# Patient Record
Sex: Male | Born: 1955 | Race: White | Hispanic: No | Smoking: Current every day smoker
Health system: Southern US, Community
[De-identification: ages and names within clinical notes are randomized; demographics above are authoritative.]

## PROBLEM LIST (undated history)

## (undated) DIAGNOSIS — R569 Unspecified convulsions: Secondary | ICD-10-CM

## (undated) DIAGNOSIS — Z01818 Encounter for other preprocedural examination: Secondary | ICD-10-CM

## (undated) DIAGNOSIS — G8918 Other acute postprocedural pain: Secondary | ICD-10-CM

## (undated) DIAGNOSIS — W19XXXA Unspecified fall, initial encounter: Secondary | ICD-10-CM

## (undated) DIAGNOSIS — G40001 Localization-related (focal) (partial) idiopathic epilepsy and epileptic syndromes with seizures of localized onset, not intractable, with status epilepticus: Secondary | ICD-10-CM

## (undated) DIAGNOSIS — Z87891 Personal history of nicotine dependence: Secondary | ICD-10-CM

## (undated) DIAGNOSIS — I69398 Other sequelae of cerebral infarction: Secondary | ICD-10-CM

## (undated) DIAGNOSIS — G40919 Epilepsy, unspecified, intractable, without status epilepticus: Principal | ICD-10-CM

## (undated) DIAGNOSIS — R609 Edema, unspecified: Secondary | ICD-10-CM

## (undated) DIAGNOSIS — M79642 Pain in left hand: Secondary | ICD-10-CM

## (undated) DIAGNOSIS — Z79899 Other long term (current) drug therapy: Secondary | ICD-10-CM

## (undated) DIAGNOSIS — M795 Residual foreign body in soft tissue: Secondary | ICD-10-CM

## (undated) DIAGNOSIS — R600 Localized edema: Secondary | ICD-10-CM

## (undated) DIAGNOSIS — M19042 Primary osteoarthritis, left hand: Secondary | ICD-10-CM

## (undated) DIAGNOSIS — S40259A Superficial foreign body of unspecified shoulder, initial encounter: Secondary | ICD-10-CM

## (undated) DIAGNOSIS — I739 Peripheral vascular disease, unspecified: Secondary | ICD-10-CM

## (undated) DIAGNOSIS — Z22322 Carrier or suspected carrier of Methicillin resistant Staphylococcus aureus: Secondary | ICD-10-CM

## (undated) DIAGNOSIS — N319 Neuromuscular dysfunction of bladder, unspecified: Secondary | ICD-10-CM

## (undated) DIAGNOSIS — G5602 Carpal tunnel syndrome, left upper limb: Secondary | ICD-10-CM

## (undated) DIAGNOSIS — R52 Pain, unspecified: Secondary | ICD-10-CM

## (undated) DIAGNOSIS — R32 Unspecified urinary incontinence: Secondary | ICD-10-CM

## (undated) DIAGNOSIS — K635 Polyp of colon: Secondary | ICD-10-CM

## (undated) DIAGNOSIS — I999 Unspecified disorder of circulatory system: Secondary | ICD-10-CM

## (undated) DIAGNOSIS — L720 Epidermal cyst: Secondary | ICD-10-CM

## (undated) HISTORY — PX: KNEE SURGERY: SHX244

## (undated) HISTORY — PX: HIP SURGERY: SHX245

## (undated) MED FILL — oxyCODONE/APAP 5-325MG TAB: 5-325 MG | 7 days supply | Qty: 28 | Fill #0 | Status: AC

---

## 2018-08-30 ENCOUNTER — Encounter (HOSPITAL_COMMUNITY): Payer: Self-pay | Admitting: *Deleted

## 2018-08-30 ENCOUNTER — Other Ambulatory Visit: Payer: Self-pay

## 2018-08-30 DIAGNOSIS — F17210 Nicotine dependence, cigarettes, uncomplicated: Secondary | ICD-10-CM | POA: Diagnosis not present

## 2018-08-30 DIAGNOSIS — R600 Localized edema: Secondary | ICD-10-CM | POA: Diagnosis not present

## 2018-08-30 DIAGNOSIS — Z79899 Other long term (current) drug therapy: Secondary | ICD-10-CM | POA: Insufficient documentation

## 2018-08-30 DIAGNOSIS — M25572 Pain in left ankle and joints of left foot: Secondary | ICD-10-CM | POA: Insufficient documentation

## 2018-08-30 DIAGNOSIS — R2243 Localized swelling, mass and lump, lower limb, bilateral: Secondary | ICD-10-CM | POA: Diagnosis present

## 2018-08-30 NOTE — ED Triage Notes (Signed)
Per EMS, [facility], he noticed swelling in his LE after stepping in a hole today.

## 2018-08-31 ENCOUNTER — Emergency Department (HOSPITAL_BASED_OUTPATIENT_CLINIC_OR_DEPARTMENT_OTHER): Payer: Medicaid - Out of State

## 2018-08-31 ENCOUNTER — Emergency Department (HOSPITAL_COMMUNITY): Payer: Medicaid - Out of State

## 2018-08-31 ENCOUNTER — Emergency Department (HOSPITAL_COMMUNITY)
Admission: EM | Admit: 2018-08-31 | Discharge: 2018-08-31 | Disposition: A | Discharge status: Home / Self Care | Payer: Medicaid - Out of State | Attending: Emergency Medicine | Admitting: Emergency Medicine

## 2018-08-31 DIAGNOSIS — R609 Edema, unspecified: Secondary | ICD-10-CM

## 2018-08-31 DIAGNOSIS — R600 Localized edema: Secondary | ICD-10-CM

## 2018-08-31 HISTORY — DX: Unspecified convulsions: R56.9

## 2018-08-31 LAB — BASIC METABOLIC PANEL
Anion gap: 7 (ref 5–15)
BUN: 15 mg/dL (ref 8–23)
CO2: 23 mmol/L (ref 22–32)
Calcium: 8.6 mg/dL — ABNORMAL LOW (ref 8.9–10.3)
Chloride: 111 mmol/L (ref 98–111)
Creatinine, Ser: 0.86 mg/dL (ref 0.61–1.24)
GFR calc Af Amer: >60 mL/min (ref >60)
GFR calc non Af Amer: >60 mL/min (ref >60)
Glucose, Bld: 101 mg/dL — ABNORMAL HIGH (ref 70–99)
Potassium: 3.9 mmol/L (ref 3.5–5.1)
Sodium: 141 mmol/L (ref 135–145)

## 2018-08-31 LAB — CBC WITH DIFFERENTIAL/PLATELET
Abs Immature Granulocytes: 0.02 10*3/uL (ref 0.00–0.07)
BASOS PCT: 1 %
Basophils Absolute: 0 10*3/uL (ref 0.0–0.1)
Eosinophils Absolute: 0.2 10*3/uL (ref 0.0–0.5)
Eosinophils Relative: 3 %
HCT: 41.5 % (ref 39.0–52.0)
Hemoglobin: 13.1 g/dL (ref 13.0–17.0)
Immature Granulocytes: 0 %
Lymphocytes Relative: 29 %
Lymphs Abs: 1.6 10*3/uL (ref 0.7–4.0)
MCH: 31.8 pg (ref 26.0–34.0)
MCHC: 31.6 g/dL (ref 30.0–36.0)
MCV: 100.7 fL — ABNORMAL HIGH (ref 80.0–100.0)
Monocytes Absolute: 0.5 10*3/uL (ref 0.1–1.0)
Monocytes Relative: 9 %
NEUTROS ABS: 3.2 10*3/uL (ref 1.7–7.7)
Neutrophils Relative %: 58 %
Platelets: 192 10*3/uL (ref 150–400)
RBC: 4.12 MIL/uL — ABNORMAL LOW (ref 4.22–5.81)
RDW: 13.9 % (ref 11.5–15.5)
WBC: 5.5 10*3/uL (ref 4.0–10.5)
nRBC: 0 % (ref 0.0–0.2)

## 2018-08-31 LAB — D-DIMER, QUANTITATIVE: D-Dimer, Quant: 0.55 ug/mL-FEU — ABNORMAL HIGH (ref 0.00–0.50)

## 2018-08-31 MED ORDER — FUROSEMIDE 10 MG/ML IJ SOLN
40.0000 mg | Freq: Once | INTRAMUSCULAR | Status: AC
  Administered 2018-08-31: 40 mg via INTRAVENOUS
  Filled 2018-08-31: qty 4

## 2018-08-31 NOTE — ED Notes (Signed)
Pt ambulated to bathroom using his personal walker

## 2018-08-31 NOTE — ED Notes (Signed)
Social work at bedside.  

## 2018-08-31 NOTE — ED Provider Notes (Signed)
[facility]-EMERGENCY DEPT Provider Note: Lowella DellJ. Lane Darlean Warmoth, MD, FACEP  CSN: [id number] MRN: [id number] ARRIVAL: 08/30/18 at 2154 ROOM: WA21/WA21   CHIEF COMPLAINT  Leg Swelling   HISTORY OF PRESENT ILLNESS  08/31/18 2:24 AM Ryan LeschesRoger Figueroa is a 63 y.o. male who is disabled.  He recently traveled here from [place] to visit family.  He is here with about 24-hour history of bilateral lower extremity edema.  The edema is moderate to severe.  He also twisted his left ankle which caused the edema on the left side to be worse.  He describes the pain in his left ankle as a dull ache.  Nothing makes the edema better or worse.  He denies chest pain or shortness of breath apart from chest pain due to a recent rib injury.  He has a seizure disorder with nearly daily grand mal seizures.  He is on Dilantin for this.   Past Medical History:  Diagnosis Date  . Seizures (HCC)     Past Surgical History:  Procedure Laterality Date  . HIP SURGERY    . KNEE SURGERY Bilateral     No family history on file.  Social History   Tobacco Use  . Smoking status: Current Every Day Smoker    Types: Cigarettes  . Smokeless tobacco: Never Used  Substance Use Topics  . Alcohol use: Never    Frequency: Never  . Drug use: Never    Prior to Admission medications   Medication Sig Start Date End Date Taking? Authorizing Provider  omeprazole (PRILOSEC) 40 MG capsule Take 40 mg by mouth daily.   Yes [provider]  phenytoin (DILANTIN) 100 MG ER capsule Take 100 mg by mouth 3 (three) times daily.   Yes [provider]    Allergies Contrast media [iodinated diagnostic agents] and Ultram [tramadol hcl]   REVIEW OF SYSTEMS  Negative except as noted here or in the History of Present Illness.   PHYSICAL EXAMINATION  Initial Vital Signs Blood pressure (!) 147/103, pulse (!) 102, temperature 98.5 F (36.9 C), temperature source Oral, resp. rate 16, SpO2 99 %.  Examination General: Well-developed,  well-nourished male in no acute distress; appears older than age of record HENT: normocephalic; atraumatic Eyes: Normal appearance Neck: supple Heart: regular rate and rhythm Lungs: clear to auscultation bilaterally Abdomen: soft; nondistended; nontender; bowel sounds present Extremities: No deformity; 2+ pitting edema of lower legs and feet, left slightly greater than the right; mild tenderness of left ankle; pulses normal Neurologic: Awake, alert and oriented; motor function intact in all extremities and symmetric; no facial droop Skin: Warm and dry Psychiatric: Normal mood and affect   RESULTS  Summary of this visit's results, reviewed by myself:   EKG Interpretation  Date/Time:    Ventricular Rate:    PR Interval:    QRS Duration:   QT Interval:    QTC Calculation:   R Axis:     Text Interpretation:        Laboratory Studies: Results for orders placed or performed during the hospital encounter of 08/31/18 (from the past 24 hour(s))  D-dimer, quantitative (not at [facility])     Status: Abnormal   Collection Time: 08/31/18  2:47 AM  Result Value Ref Range   D-Dimer, Quant 0.55 (H) 0.00 - 0.50 ug/mL-FEU  CBC with Differential/Platelet     Status: Abnormal   Collection Time: 08/31/18  2:47 AM  Result Value Ref Range   WBC 5.5 4.0 - 10.5 K/uL   RBC 4.12 (  L) 4.22 - 5.81 MIL/uL   Hemoglobin 13.1 13.0 - 17.0 g/dL   HCT 11.5 72.6 - 20.3 %   MCV 100.7 (H) 80.0 - 100.0 fL   MCH 31.8 26.0 - 34.0 pg   MCHC 31.6 30.0 - 36.0 g/dL   RDW 55.9 74.1 - 63.8 %   Platelets 192 150 - 400 K/uL   nRBC 0.0 0.0 - 0.2 %   Neutrophils Relative % 58 %   Neutro Abs 3.2 1.7 - 7.7 K/uL   Lymphocytes Relative 29 %   Lymphs Abs 1.6 0.7 - 4.0 K/uL   Monocytes Relative 9 %   Monocytes Absolute 0.5 0.1 - 1.0 K/uL   Eosinophils Relative 3 %   Eosinophils Absolute 0.2 0.0 - 0.5 K/uL   Basophils Relative 1 %   Basophils Absolute 0.0 0.0 - 0.1 K/uL   Immature Granulocytes 0 %   Abs Immature  Granulocytes 0.02 0.00 - 0.07 K/uL  Basic metabolic panel     Status: Abnormal   Collection Time: 08/31/18  2:47 AM  Result Value Ref Range   Sodium 141 135 - 145 mmol/L   Potassium 3.9 3.5 - 5.1 mmol/L   Chloride 111 98 - 111 mmol/L   CO2 23 22 - 32 mmol/L   Glucose, Bld 101 (H) 70 - 99 mg/dL   BUN 15 8 - 23 mg/dL   Creatinine, Ser 4.53 0.61 - 1.24 mg/dL   Calcium 8.6 (L) 8.9 - 10.3 mg/dL   GFR calc non Af Amer >60 >60 mL/min   GFR calc Af Amer >60 >60 mL/min   Anion gap 7 5 - 15   Imaging Studies: Dg Ankle Complete Left  Result Date: 08/31/2018 CLINICAL DATA:  Ankle pain/swelling EXAM: LEFT ANKLE COMPLETE - 3+ VIEW COMPARISON:  None. FINDINGS: No fracture or dislocation is seen. The ankle mortise is intact. The base of the fifth metatarsal is unremarkable. Mild diffuse soft tissue swelling. IMPRESSION: No fracture or dislocation is seen. Mild diffuse soft tissue swelling. Electronically Signed   By: Charline Bills M.D.   On: 08/31/2018 03:15    ED COURSE and MDM  Nursing notes and initial vitals signs, including pulse oximetry, reviewed.  Vitals:   08/31/18 0445 08/31/18 0500 08/31/18 0515 08/31/18 0530  BP:  123/78  (!) 122/93  Pulse:   76 72  Resp:    15  Temp:      TempSrc:      SpO2: 100%   97%   7:29 AM Legs less tense after Lasix diuresis.  Awaiting lower extremity Doppler studies to evaluate for DVTs.  PROCEDURES    ED DIAGNOSES     ICD-10-CM   1. Bilateral lower extremity edema R60.0        Maxximus Gotay, MD 09/03/18 1049

## 2018-08-31 NOTE — ED Notes (Signed)
Pt called nurse to bathroom to look at pt's stool.  It was loose, orange in color with mucus.  Pt stated to this RN he was concerned about his gallbladder. Pt c/o of abdominal pain in LLQ.

## 2018-08-31 NOTE — Progress Notes (Signed)
CSW aware of consult. CSW spoke with patient at bedside who reports he is currently homeless and staying at the [facility]. Per patient, he just moved here from [place] to help out his son but that they are not currently in communication. Patient states he cannot currently afford his medication for his seizures. Per patient, with his insurance his co-pay is only a few dollars. CSW consulted with Chiropodist who provided patient with financial assistance for medications. CSW also provided patient with a homeless shelter resources list and bus passes to get to [place] and then back to [place]. Patient expressed no further CSW needs at this time. Please reconsult if needs arise.  Archie Balboa, LCSWA  Clinical Social Work Department  [employer]  [phone]

## 2018-08-31 NOTE — ED Notes (Signed)
This Clinical research associate called and left a message to social work.

## 2018-08-31 NOTE — Progress Notes (Signed)
Bilateral lower extremity venous duplex has been completed. Negative for DVT. Results were given to Dr. Madilyn Hook.   08/31/18 8:50 AM Olen Cordial RVT

## 2018-08-31 NOTE — ED Notes (Signed)
When trying to D/C pt, he states he needed to speak to a Child psychotherapist before leaving.  MD notified.

## 2018-09-05 ENCOUNTER — Other Ambulatory Visit: Payer: Self-pay | Admitting: Critical Care Medicine

## 2018-09-05 ENCOUNTER — Encounter: Payer: Self-pay | Admitting: Critical Care Medicine

## 2018-09-05 MED ORDER — TAMSULOSIN HCL 0.4 MG PO CAPS: 0.4000 mg | ORAL_CAPSULE | Freq: Every day | ORAL | 3 refills | Status: AC

## 2018-09-05 MED ORDER — PHENYTOIN SODIUM EXTENDED 100 MG PO CAPS: 100.0000 mg | ORAL_CAPSULE | Freq: Three times a day (TID) | ORAL | 1 refills | Status: AC

## 2018-09-05 MED ORDER — AMOXICILLIN-POT CLAVULANATE 875-125 MG PO TABS: 1.0000 | ORAL_TABLET | Freq: Two times a day (BID) | ORAL | 0 refills | Status: DC

## 2018-09-05 MED ORDER — OMEPRAZOLE 40 MG PO CPDR: 40.0000 mg | DELAYED_RELEASE_CAPSULE | Freq: Every day | ORAL | 1 refills | Status: AC

## 2018-09-05 NOTE — Progress Notes (Signed)
See documentation  Bronchitis, refills, hemorrhoids

## 2018-09-06 NOTE — Progress Notes (Signed)
This is a 63 year old white male who was seen in the [facility] shelter clinic with needs for refilled medications.  The patient gives a history of just moving here from [place] 3 weeks ago.  He was hospitalized in a while [place] for pneumonia and a seizure disorder.  He was placed on seizure medications which was Dilantin 100 mg 3 times daily and he has now out of this medication.  The patient also has reflux disease and takes omeprazole.  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  He does require a walker to ambulate.  The seizure has been a lifelong diagnosis.  He smokes less than a half a pack a day of cigarettes.  Allergies for this patient includes tramadol and IV contrast  Patient also states he has had some loose stools with blood mixed with the stool.  He is also had minimal hemoptysis for the past 2 days.  He denies any fever.  The patient currently is staying in the lobby of the [facility] and has not yet acquired a bed upstairs.  The health system he came from in [place] is not on epic so I do not have access to any of his medical records.  Note the patient did go to the emergency room on January 17 for evaluation of lower extremity edema.  The patient had a negative left ankle x-ray.  Vascular ultrasound of both lower extremities showed no deep venous thrombosis.  The patient was given 1 dose of furosemide on discharge.   Physical exam  Temperature 97 pulse 74 saturations 95% room air  Chest exam was clear without evidence of wheeze rale or rhonchi  Cardiac exam showed regular rate and rhythm without S3 or S4 normal S1-S2 no murmur  Abdomen exam was benign  Extremities showed no tenderness in the knees or hips   There was 2+  edema left greater than right lower extremity  Rectal  exam shows significant hemorrhoids with no evidence of blood   Impression   History of seizure disorder and the patient is now out of Dilantin        a bridging prescription of Dilantin 100 mg 3 times daily was sent to our local pharmacy for the Congregational nurse to obtain   History of benign prostatic hypertrophy with need for Flomax refill    a refill of Flomax was also sent to the local pharmacy   Gastroesophageal reflux disease and previous gastric ulcer   A refill on omeprazole 40 mg daily was sent to the pharmacy   Bilateral lower extremity edema with negative recent venous Doppler ultrasound   This patient would benefit from a follow-up visit in my clinic [facility] for further evaluations and laboratory studies  Lab Results  Component Value Date   WBC 5.5 08/31/2018   HGB 13.1 08/31/2018   HCT 41.5 08/31/2018   MCV 100.7 (H) 08/31/2018   PLT 192 08/31/2018   BMP Latest Ref Rng & Units 08/31/2018  Glucose 70 - 99 mg/dL 953(U)  BUN 8 - 23 mg/dL 15  Creatinine 0.23 - 3.43 mg/dL 5.68  Sodium 616 - 837 mmol/L 141  Potassium 3.5 - 5.1 mmol/L 3.9  Chloride 98 - 111 mmol/L 111  CO2 22 - 32 mmol/L 23  Calcium 8.9 - 10.3 mg/dL 2.9(M)

## 2018-09-08 ENCOUNTER — Encounter (HOSPITAL_COMMUNITY): Payer: Self-pay | Admitting: Emergency Medicine

## 2018-09-08 ENCOUNTER — Other Ambulatory Visit: Payer: Self-pay

## 2018-09-08 ENCOUNTER — Emergency Department (HOSPITAL_COMMUNITY)
Admission: EM | Admit: 2018-09-08 | Discharge: 2018-09-09 | Disposition: A | Discharge status: Home / Self Care | Payer: Self-pay | Attending: Emergency Medicine | Admitting: Emergency Medicine

## 2018-09-08 DIAGNOSIS — L03116 Cellulitis of left lower limb: Secondary | ICD-10-CM | POA: Insufficient documentation

## 2018-09-08 DIAGNOSIS — F17210 Nicotine dependence, cigarettes, uncomplicated: Secondary | ICD-10-CM | POA: Insufficient documentation

## 2018-09-08 NOTE — ED Triage Notes (Signed)
Patient c/o swelling to bilateral legs x1 week. Seen on 1/17 for same. Redness noted.

## 2018-09-09 LAB — COMPREHENSIVE METABOLIC PANEL
ALT: 15 U/L (ref 0–44)
AST: 19 U/L (ref 15–41)
Albumin: 3.6 g/dL (ref 3.5–5.0)
Alkaline Phosphatase: 155 U/L — ABNORMAL HIGH (ref 38–126)
Anion gap: 7 (ref 5–15)
BUN: 17 mg/dL (ref 8–23)
CO2: 19 mmol/L — ABNORMAL LOW (ref 22–32)
Calcium: 8.3 mg/dL — ABNORMAL LOW (ref 8.9–10.3)
Chloride: 112 mmol/L — ABNORMAL HIGH (ref 98–111)
Creatinine, Ser: 0.89 mg/dL (ref 0.61–1.24)
GFR calc Af Amer: >60 mL/min (ref >60)
GFR calc non Af Amer: >60 mL/min (ref >60)
GLUCOSE: 101 mg/dL — AB (ref 70–99)
Potassium: 3.6 mmol/L (ref 3.5–5.1)
Sodium: 138 mmol/L (ref 135–145)
TOTAL PROTEIN: 6.5 g/dL (ref 6.5–8.1)
Total Bilirubin: 0.4 mg/dL (ref 0.3–1.2)

## 2018-09-09 LAB — CBC WITH DIFFERENTIAL/PLATELET
Abs Immature Granulocytes: 0.01 10*3/uL (ref 0.00–0.07)
Basophils Absolute: 0 10*3/uL (ref 0.0–0.1)
Basophils Relative: 1 %
Eosinophils Absolute: 0.2 10*3/uL (ref 0.0–0.5)
Eosinophils Relative: 3 %
HCT: 37.2 % — ABNORMAL LOW (ref 39.0–52.0)
Hemoglobin: 11.8 g/dL — ABNORMAL LOW (ref 13.0–17.0)
Immature Granulocytes: 0 %
Lymphocytes Relative: 24 %
Lymphs Abs: 1.2 10*3/uL (ref 0.7–4.0)
MCH: 32.2 pg (ref 26.0–34.0)
MCHC: 31.7 g/dL (ref 30.0–36.0)
MCV: 101.4 fL — ABNORMAL HIGH (ref 80.0–100.0)
Monocytes Absolute: 0.6 10*3/uL (ref 0.1–1.0)
Monocytes Relative: 12 %
Neutro Abs: 2.9 10*3/uL (ref 1.7–7.7)
Neutrophils Relative %: 60 %
PLATELETS: 168 10*3/uL (ref 150–400)
RBC: 3.67 MIL/uL — ABNORMAL LOW (ref 4.22–5.81)
RDW: 14.2 % (ref 11.5–15.5)
WBC: 4.8 10*3/uL (ref 4.0–10.5)
nRBC: 0 % (ref 0.0–0.2)

## 2018-09-09 LAB — PHENYTOIN LEVEL, TOTAL: Phenytoin Lvl: 3.9 ug/mL — ABNORMAL LOW (ref 10.0–20.0)

## 2018-09-09 LAB — LACTIC ACID, PLASMA: Lactic Acid, Venous: 1.5 mmol/L (ref 0.5–1.9)

## 2018-09-09 MED ORDER — DOXYCYCLINE HYCLATE 100 MG PO TABS
100.0000 mg | ORAL_TABLET | Freq: Once | ORAL | Status: AC
  Administered 2018-09-09: 100 mg via ORAL
  Filled 2018-09-09: qty 1

## 2018-09-09 MED ORDER — VANCOMYCIN HCL IN DEXTROSE 1-5 GM/200ML-% IV SOLN
1000.0000 mg | Freq: Once | INTRAVENOUS | Status: AC
  Administered 2018-09-09: 1000 mg via INTRAVENOUS
  Filled 2018-09-09: qty 200

## 2018-09-09 MED ORDER — DOXYCYCLINE HYCLATE 100 MG PO CAPS: 100.0000 mg | ORAL_CAPSULE | Freq: Two times a day (BID) | ORAL | 0 refills | Status: DC

## 2018-09-09 MED ORDER — HYDROCODONE-ACETAMINOPHEN 5-325 MG PO TABS
1.0000 | ORAL_TABLET | Freq: Once | ORAL | Status: AC
  Administered 2018-09-09: 1 via ORAL
  Filled 2018-09-09: qty 1

## 2018-09-09 NOTE — Discharge Instructions (Signed)
We saw in the ER for worsening in the leg swelling.  Please add doxycycline to your Augmentin.   Try to see if he can be seen by the outpatient doctor again within the next few days.  If they are unable to see you then return to the ER in 2 to 3 days for wound recheck

## 2018-09-09 NOTE — ED Provider Notes (Signed)
[facility]-EMERGENCY DEPT Provider Note   CSN: [id number] Arrival date & time: 09/08/18  2247     History   Chief Complaint Chief Complaint  Patient presents with  . Leg Swelling    HPI Rozann LeschesRoger Balaguer is a 63 y.o. male.  HPI 63 year old male comes in with chief complaint of leg swelling and pain. He has history of seizure disorder, BPH.  He is new to this area and followed by community wellness clinic.  Patient reports that he has been having bilateral lower extremity swelling for the last several days.  He was started on antibiotics by the outpatient team, but despite taking the medications his symptoms have progressed.  Patient denies any nausea, vomiting, fevers, chills.  The main reason for him to come to the ER is because of increasing swelling with pain -precautions ever given to him by the outpatient team.  Past Medical History:  Diagnosis Date  . Seizures (HCC)     There are no active problems to display for this patient.   Past Surgical History:  Procedure Laterality Date  . HIP SURGERY    . KNEE SURGERY Bilateral         Home Medications    Prior to Admission medications   Medication Sig Start Date End Date Taking? Authorizing Provider  amoxicillin-clavulanate (AUGMENTIN) 875-125 MG tablet Take 1 tablet by mouth 2 (two) times daily. 09/05/18  Yes Storm FriskWright, Patrick E, MD  omeprazole (PRILOSEC) 40 MG capsule Take 1 capsule (40 mg total) by mouth daily. 09/05/18  Yes Storm FriskWright, Patrick E, MD  phenytoin (DILANTIN) 100 MG ER capsule Take 1 capsule (100 mg total) by mouth 3 (three) times daily. 09/05/18  Yes Storm FriskWright, Patrick E, MD  tamsulosin (FLOMAX) 0.4 MG CAPS capsule Take 1 capsule (0.4 mg total) by mouth daily. 09/05/18  Yes Storm FriskWright, Patrick E, MD  doxycycline (VIBRAMYCIN) 100 MG capsule Take 1 capsule (100 mg total) by mouth 2 (two) times daily. 09/09/18   Derwood KaplanNanavati, Damon Hargrove, MD    Family History No family history on file.  Social  History Social History   Tobacco Use  . Smoking status: Current Every Day Smoker    Types: Cigarettes  . Smokeless tobacco: Never Used  Substance Use Topics  . Alcohol use: Never    Frequency: Never  . Drug use: Never     Allergies   Contrast media [iodinated diagnostic agents] and Ultram [tramadol hcl]   Review of Systems Review of Systems  Constitutional: Positive for activity change. Negative for fever.  Gastrointestinal: Negative for nausea and vomiting.  Musculoskeletal: Positive for myalgias.  Skin: Positive for rash.  Allergic/Immunologic: Negative for immunocompromised state.     Physical Exam Updated Vital Signs BP 114/80   Pulse 100   Temp 98.5 F (36.9 C) (Oral)   Resp 18   SpO2 93%   Physical Exam Vitals signs and nursing note reviewed.  Constitutional:      Appearance: He is well-developed.  Cardiovascular:     Rate and Rhythm: Normal rate.  Pulmonary:     Effort: Pulmonary effort is normal.  Musculoskeletal:        General: Swelling present.     Right lower leg: Edema present.     Left lower leg: Edema present.     Comments: Patient has bilateral lower extremity pitting edema, left worse than right.  He also has fine rubor and calor along with tenderness to palpation.  Skin:    General: Skin is warm.  Neurological:     Mental Status: He is alert and oriented to person, place, and time.          ED Treatments / Results  Labs (all labs ordered are listed, but only abnormal results are displayed) Labs Reviewed  COMPREHENSIVE METABOLIC PANEL - Abnormal; Notable for the following components:      Result Value   Chloride 112 (*)    CO2 19 (*)    Glucose, Bld 101 (*)    Calcium 8.3 (*)    Alkaline Phosphatase 155 (*)    All other components within normal limits  CBC WITH DIFFERENTIAL/PLATELET - Abnormal; Notable for the following components:   RBC 3.67 (*)    Hemoglobin 11.8 (*)    HCT 37.2 (*)    MCV 101.4 (*)    All other  components within normal limits  PHENYTOIN LEVEL, TOTAL - Abnormal; Notable for the following components:   Phenytoin Lvl 3.9 (*)    All other components within normal limits  LACTIC ACID, PLASMA  LACTIC ACID, PLASMA    EKG None  Radiology No results found.  Procedures Procedures (including critical care time)  Medications Ordered in ED Medications  HYDROcodone-acetaminophen (NORCO/VICODIN) 5-325 MG per tablet 1 tablet (1 tablet Oral Given 09/09/18 0113)  vancomycin (VANCOCIN) IVPB 1000 mg/200 mL premix (1,000 mg Intravenous New Bag/Given 09/09/18 0230)  doxycycline (VIBRA-TABS) tablet 100 mg (100 mg Oral Given 09/09/18 0230)     Initial Impression / Assessment and Plan / ED Course  I have reviewed the triage vital signs and the nursing notes.  Pertinent labs & imaging results that were available during my care of the patient were reviewed by me and considered in my medical decision making (see chart for details).     63 year old male comes in with chief complaint of worsening leg swelling and pain. He is being treated as a cellulitis with Augmentin.  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  He does not have any systemic symptoms, the labs are reassuring.  We have given him IV vancomycin here, I think he would benefit by addition of doxycycline to the Augmentin prescription.  The other possibilities that he is having stasis dermatitis.  I encouraged patient that he needs to get compression stockings and keep his legs elevated at nighttime.  We have advised patient to return to the ER in 2 days for wound recheck if he cannot secure an outpatient follow-up within the next 2 to 3 days.  Strict ER return precautions have also been discussed and patient is in agreement with the plan. His Dilantin level was ordered for the benefit of outpatient team, we will not be making any adjustment to his Dilantin dose.  Final Clinical Impressions(s) / ED Diagnoses   Final diagnoses:   Cellulitis of left lower extremity    ED Discharge Orders         Ordered    doxycycline (VIBRAMYCIN) 100 MG capsule  2 times daily     09/09/18 0500           Derwood KaplanNanavati, Dyamond Tolosa, MD 09/09/18 [phone]

## 2018-09-10 ENCOUNTER — Encounter (HOSPITAL_COMMUNITY): Payer: Self-pay | Admitting: Emergency Medicine

## 2018-09-10 ENCOUNTER — Emergency Department (HOSPITAL_COMMUNITY)
Admission: EM | Admit: 2018-09-10 | Discharge: 2018-09-10 | Disposition: A | Discharge status: Home / Self Care | Payer: Self-pay | Attending: Emergency Medicine | Admitting: Emergency Medicine

## 2018-09-10 ENCOUNTER — Emergency Department (HOSPITAL_COMMUNITY): Payer: Self-pay

## 2018-09-10 DIAGNOSIS — I878 Other specified disorders of veins: Secondary | ICD-10-CM

## 2018-09-10 DIAGNOSIS — Z86718 Personal history of other venous thrombosis and embolism: Secondary | ICD-10-CM | POA: Insufficient documentation

## 2018-09-10 DIAGNOSIS — I872 Venous insufficiency (chronic) (peripheral): Secondary | ICD-10-CM | POA: Insufficient documentation

## 2018-09-10 DIAGNOSIS — Z79899 Other long term (current) drug therapy: Secondary | ICD-10-CM | POA: Insufficient documentation

## 2018-09-10 DIAGNOSIS — F17210 Nicotine dependence, cigarettes, uncomplicated: Secondary | ICD-10-CM | POA: Insufficient documentation

## 2018-09-10 LAB — URINALYSIS, ROUTINE W REFLEX MICROSCOPIC
Bilirubin Urine: NEGATIVE
Glucose, UA: NEGATIVE mg/dL
Ketones, ur: NEGATIVE mg/dL
Leukocytes, UA: NEGATIVE
Nitrite: NEGATIVE
Protein, ur: NEGATIVE mg/dL
SPECIFIC GRAVITY, URINE: 1.005 (ref 1.005–1.030)
pH: 5 (ref 5.0–8.0)

## 2018-09-10 LAB — I-STAT TROPONIN, ED: Troponin i, poc: 0.01 ng/mL (ref 0.00–0.08)

## 2018-09-10 LAB — BRAIN NATRIURETIC PEPTIDE: B Natriuretic Peptide: 19.6 pg/mL (ref 0.0–100.0)

## 2018-09-10 MED ORDER — VANCOMYCIN HCL IN DEXTROSE 1-5 GM/200ML-% IV SOLN
1000.0000 mg | Freq: Once | INTRAVENOUS | Status: AC
  Administered 2018-09-10: 1000 mg via INTRAVENOUS
  Filled 2018-09-10: qty 200

## 2018-09-10 MED ORDER — FUROSEMIDE 10 MG/ML IJ SOLN
20.0000 mg | Freq: Once | INTRAMUSCULAR | Status: AC
  Administered 2018-09-10: 20 mg via INTRAVENOUS
  Filled 2018-09-10: qty 4

## 2018-09-10 MED ORDER — FUROSEMIDE 20 MG PO TABS: 20.0000 mg | ORAL_TABLET | Freq: Every day | ORAL | 0 refills | Status: DC

## 2018-09-10 NOTE — ED Triage Notes (Signed)
Pt c/o leg swelling again. "this makes the third time I have had to come in here".

## 2018-09-10 NOTE — ED Notes (Signed)
Patient transported to X-ray 

## 2018-09-10 NOTE — Progress Notes (Signed)
CSW aware of consult. CSW familiar with patient from prior visit. CSW spoke with patient at bedside who reports [facility] and was supposed to meet with intake this morning about getting a bed. Per patient, someone had told the person running the shelter yesterday that he was contagious and could not return. [facility] with [facility] who stated patient could return but that there were no beds available at this time but he could continue to stay in the lobby. CSW relayed this information to patient who expressed understanding. CSW also provided patient with information [place] [facility] and homeless shelter resources. Per patient, staying in the shelter is only temporary and that once he gets his insurance transferred from [place] to [place] then he would like to go to an ALF or look into buying a house. Patient declined needing further resources. Please reconsult if needs arise.  Archie BalboaMackenzie Irwin, LCSWA  Clinical Social Work Department  [facility]  [phone]

## 2018-09-10 NOTE — Discharge Instructions (Signed)
1.  Call the wound care center and get an appointment as soon as possible.  The main treatment for venous stasis is compression, elevation and managing all underlying medical conditions. 2.  Take 20 mg of Lasix daily for the next 3 to 4 days.  After that, you will need to see your medical doctor and determine if ongoing Lasix is appropriate for you as well as your response to this treatment. 3.  Fill the prescription of doxycycline that was given to you yesterday.  Start it on the morning of 1\28. 4.  Although you are getting antibiotics, your swelling is most likely the result of venous stasis which is a condition of poor blood flow through the veins.  Cellulitis is not contagious and venous stasis is not contagious.  You are safe to return to [facility] without risk of infection of others. 5.  Continue treatment as outlined.  Return to the emergency department if you are not seeing any improvement in the next 3 to 4 days or if there is worsening or new symptoms develop.

## 2018-09-10 NOTE — ED Notes (Signed)
Patient given discharge teaching and verbalized understanding. Patient ambulated out of ED with a steady gait. 

## 2018-09-10 NOTE — ED Provider Notes (Signed)
[facility]-EMERGENCY DEPT Provider Note   CSN: [id number] Arrival date & time: 09/10/18  7948     History   Chief Complaint Chief Complaint  Patient presents with  . Leg Swelling    HPI Ryan Figueroa is a 63 y.o. male.  HPI Patient reports he has ongoing swelling of both of his legs.  He reports this started predominantly in the left leg but then progressed to include both legs.  This has been going on for several months.  Patient came to this area from [place] on a [employer] bus.  He reports he had about a 16-hour bus ride.  That however was a couple months ago.  He has been seen several times since then.  He reports nothing is helping it improved.  He reports he continues to be red swollen and uncomfortable.  He denies before his travel, having problems with swelling like this.  He denies chest pain or shortness of breath.  Patient is a chronic smoker since young age.  No alcohol use.  He reports he has h/o epilepsy so he does not drink alcohol.  He is currently living at [facility].  He reports that the threw him out because somebody started saying that he was contagious because his legs were red and swollen.  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 Past Medical History:  Diagnosis Date  . Seizures (HCC)     There are no active problems to display for this patient.   Past Surgical History:  Procedure Laterality Date  . HIP SURGERY    . KNEE SURGERY Bilateral         Home Medications    Prior to Admission medications   Medication Sig Start Date End Date Taking? Authorizing Provider  amoxicillin-clavulanate (AUGMENTIN) 875-125 MG tablet Take 1 tablet by mouth 2 (two) times daily. 09/05/18  Yes Storm Frisk, MD  omeprazole (PRILOSEC) 40 MG capsule Take 1 capsule (40 mg total) by mouth daily. 09/05/18  Yes Storm Frisk, MD  phenytoin (DILANTIN) 100 MG ER capsule Take 1 capsule (100 mg total) by mouth 3 (three) times daily. 09/05/18  Yes Storm Frisk, MD  tamsulosin (FLOMAX) 0.4 MG CAPS capsule Take 1 capsule (0.4 mg total) by mouth daily. 09/05/18  Yes Storm Frisk, MD  doxycycline (VIBRAMYCIN) 100 MG capsule Take 1 capsule (100 mg total) by mouth 2 (two) times daily. 09/09/18   Derwood Kaplan, MD  furosemide (LASIX) 20 MG tablet Take 1 tablet (20 mg total) by mouth daily. 09/10/18   Arby Barrette, MD    Family History No family history on file.  Social History Social History   Tobacco Use  . Smoking status: Current Every Day Smoker    Types: Cigarettes  . Smokeless tobacco: Never Used  Substance Use Topics  . Alcohol use: Never    Frequency: Never  . Drug use: Never     Allergies   Contrast media [iodinated diagnostic agents] and Ultram [tramadol hcl]   Review of Systems Review of Systems 10 Systems reviewed and are negative for acute change except as noted in the HPI.   Physical Exam Updated Vital Signs BP (!) 142/83 (BP Location: Right Arm)   Pulse 87   Temp 97.6 F (36.4 C) (Oral)   Resp 18   SpO2 100%  Physical Exam Constitutional:      Comments: Patient is alert and nontoxic.  Mental status is clear.  No respiratory distress.  Clinically well in appearance.  HENT:     Head: Normocephalic and atraumatic.     Mouth/Throat:     Comments: Poor dentition with upper dentition few teeth that are decayed to the gumline.  Lower dentition few teeth present but severe decay.  No periodontal inflammation or facial swelling.  Posterior airway widely patent. Eyes:     Extraocular Movements: Extraocular movements intact.  Cardiovascular:     Rate and Rhythm: Normal rate and regular rhythm.     Pulses: Normal pulses.     Heart sounds: Normal heart sounds.  Pulmonary:     Effort: Pulmonary effort is normal.     Breath sounds: Normal breath sounds.  Abdominal:      General: There is no distension.     Palpations: Abdomen is soft.     Tenderness: There is no abdominal tenderness. There is no guarding.  Musculoskeletal:     Comments: Patient has 3+ pitting edema bilateral lower extremities below the knee.  Left slightly greater than right.  No effusion or erythema of the knees.  Both knees have old, well-healed incisions from replacement.  Erythema is diffuse.  Pedal pulses intact.  No open wounds.  See attached images.  Skin:    General: Skin is warm and dry.  Neurological:     General: No focal deficit present.     Mental Status: He is oriented to person, place, and time.     Coordination: Coordination normal.  Psychiatric:        Mood and Affect: Mood normal.        ED Treatments / Results  Labs (all labs ordered are listed, but only abnormal results are displayed) Labs Reviewed  BRAIN NATRIURETIC PEPTIDE  URINALYSIS, ROUTINE W REFLEX MICROSCOPIC  I-STAT TROPONIN, ED    EKG None  Radiology Dg Chest 2 View  Result Date: 09/10/2018 CLINICAL DATA:  Bilateral lower extremity swelling. EXAM: CHEST - 2 VIEW COMPARISON:  None. FINDINGS: The cardiomediastinal silhouette is within normal limits. The lungs are hyperinflated with mild interstitial coarsening. No confluent airspace opacity, edema, pleural effusion, or pneumothorax is identified. A lateral left fourth rib fracture is likely nonacute. IMPRESSION: Suspected COPD without evidence of pneumonia or edema. Electronically Signed   By: Sebastian Ache M.D.   On: 09/10/2018 08:06    Procedures Procedures (including critical care time)  Medications Ordered in ED Medications  vancomycin (VANCOCIN) IVPB 1000 mg/200 mL premix (1,000 mg Intravenous New Bag/Given 09/10/18 0857)  furosemide (LASIX) injection 20 mg (20 mg Intravenous Given 09/10/18 0854)     Initial Impression / Assessment and Plan / ED Course  I have reviewed the triage vital signs and the nursing notes.  Pertinent labs &  imaging results that were available during my care of the patient were reviewed by me and considered in my medical decision making (see chart for details).     Patient is clinically well in appearance.  He does not show signs of systemic infection.  His white blood cell count has been stable.  Patient has not developed fever.  Patient has empirically been treated for cellulitis and given vancomycin yesterday and discharged with prescription for doxycycline which he reports he has not yet filled.  He has been on Augmentin for almost 10 days without improvement.  Appearance looks unchanged from documentation previously.  I have a  high suspicion for chronic venous stasis as a contributing factor to the patient's edema.  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  He has pre-existing conditions that would be consistent with venous stasis.  He is living at [facility] and not really been able to elevate his legs.  He does spend much time sitting or standing.  1 of the main reasons the patient has come today, is because [facility] ejected him due to someone stating that he was contagious with MRSA.  Chest x-ray does not show vascular congestion and BNP is normal making CHF much lower probability.  Patient does not endorse review of systems consistent with CHF.  At this time will treat with low-dose Lasix to help with edema, patient is given ambulatory referral to wound care clinic for evaluation for compression and wraps.  Patient is getting established with routine medical care and does have outpatient follow-up in place.  Return precautions reviewed.  Final Clinical Impressions(s) / ED Diagnoses   Final diagnoses:  Venous stasis    ED Discharge Orders         Ordered    furosemide (LASIX) 20 MG tablet  Daily     09/10/18 1000    AMB referral to wound care center    Comments:  Compression and venous stasis management   09/10/18 1008           Arby BarrettePfeiffer,  Marvena Tally, MD 09/10/18 1018

## 2018-09-10 NOTE — ED Notes (Signed)
ED Provider at bedside. 

## 2018-09-10 NOTE — ED Notes (Signed)
Urinal at bedside. Pt has been made aware that MD needs UA sample. 

## 2018-09-12 ENCOUNTER — Encounter: Payer: Self-pay | Admitting: Critical Care Medicine

## 2018-09-12 ENCOUNTER — Other Ambulatory Visit: Payer: Self-pay | Admitting: Critical Care Medicine

## 2018-09-12 MED ORDER — TRIAMCINOLONE ACETONIDE 0.1 % EX CREA: 1.0000 "application " | TOPICAL_CREAM | Freq: Three times a day (TID) | CUTANEOUS | 0 refills | Status: AC

## 2018-09-12 MED ORDER — DOXYCYCLINE HYCLATE 100 MG PO CAPS: 100.0000 mg | ORAL_CAPSULE | Freq: Two times a day (BID) | ORAL | 0 refills | Status: AC

## 2018-09-12 MED ORDER — FUROSEMIDE 20 MG PO TABS: 20.0000 mg | ORAL_TABLET | Freq: Every day | ORAL | 0 refills | Status: AC

## 2018-09-12 NOTE — Progress Notes (Unsigned)
See documentation.

## 2018-09-12 NOTE — Progress Notes (Signed)
This is a 63 year old white male who was seen in the [facility] with needs for refilled medications.  The patient gives a history of just moving here from [place] 4 weeks ago.  He was hospitalized in a while [place] for pneumonia and a seizure disorder.  He was placed on seizure medications which was Dilantin 100 mg 3 times daily  The patient also has reflux disease and takes omeprazole.  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  He does require a walker to ambulate.  The seizure has been a lifelong diagnosis.  He smokes less than a half a pack a day of cigarettes.  Allergies for this patient includes tramadol and IV contrast   The patient currently is staying in the lobby of the [facility] and has not yet acquired a bed upstairs.  The health system he came from in [place] is not on epic so I do not have access to any of his medical records.  Note the patient did go to the emergency room on January 17 for evaluation of lower extremity edema.  The patient had a negative left ankle x-ray.  Vascular ultrasound of both lower extremities showed no deep venous thrombosis.  The patient was given 1 dose of furosemide on discharge.  I saw the patient a week ago on January 22 and diagnosed venous stasis dermatitis and early cellulitis in the left leg.  At that visit we prescribed Augmentin.  The patient actually did not improve that much with the Augmentin and went to the emergency room on 09/09/2018.  At that visit he was prescribed doxycycline and was given a course of IV vancomycin.  The patient returned to the emergency room on 27 January and given a prescription for Lasix for which he has not yet filled.  Physical exam  Temperature 97 pulse 74 saturations 95% room air blood pressure  126/84  Chest exam was clear without evidence of wheeze rale or rhonchi  Cardiac exam showed regular rate and rhythm without S3 or S4 normal S1-S2 no murmur  Abdomen exam was benign  Extremities showed no tenderness in the knees or hips   There was 2+  edema left greater than right lower extremity There was severe erythema in the left greater than right lower extremity with tenderness to touch     Impression  Bilateral lower extremity edema with negative recent venous Doppler ultrasound, and now ongoing venous stasis dermatitis  The plan would be to send a prescription for the doxycycline to be filled tonight 100 mg twice daily for 7 days he will also get Lasix 20 mg daily for 7 days triamcinolone cream 0.5% to mix with body lotion applied twice daily to both lower extremities  A wound care clinic appointment will also be made   This patient would benefit from a follow-up [facility] for further evaluations and laboratory studies  Lab Results  Component Value Date   WBC 4.8 09/09/2018   HGB 11.8 (L) 09/09/2018   HCT 37.2 (L) 09/09/2018   MCV 101.4 (H) 09/09/2018   PLT 168 09/09/2018   BMP Latest Ref Rng & Units 09/09/2018 08/31/2018  Glucose 70 - 99 mg/dL 161(W101(H) 960(A101(H)  BUN 8 - 23 mg/dL 17 15  Creatinine 5.400.61 - 1.24 mg/dL  0.89 0.86  Sodium 135 - 145 mmol/L 138 141  Potassium 3.5 - 5.1 mmol/L 3.6 3.9  Chloride 98 - 111 mmol/L 112(H) 111  CO2 22 - 32 mmol/L 19(L) 23  Calcium 8.9 - 10.3 mg/dL 8.3(L) 8.6(L)

## 2018-09-17 ENCOUNTER — Other Ambulatory Visit: Payer: Self-pay | Admitting: Critical Care Medicine

## 2018-09-17 DIAGNOSIS — I872 Venous insufficiency (chronic) (peripheral): Secondary | ICD-10-CM

## 2018-09-17 NOTE — Progress Notes (Signed)
Will see if vascular surgery has any ideas about the patients venous stasis dermatitis

## 2018-09-24 ENCOUNTER — Ambulatory Visit (HOSPITAL_BASED_OUTPATIENT_CLINIC_OR_DEPARTMENT_OTHER): Payer: Medicaid - Out of State

## 2019-07-03 IMAGING — CR DG ANKLE COMPLETE 3+V*L*
3 series · 3 of 3 positions shown · non-contrast
Comparison: None.

CLINICAL DATA: Ankle pain/swelling

EXAM:
LEFT ANKLE COMPLETE - 3+ VIEW

[x ankle ap left]
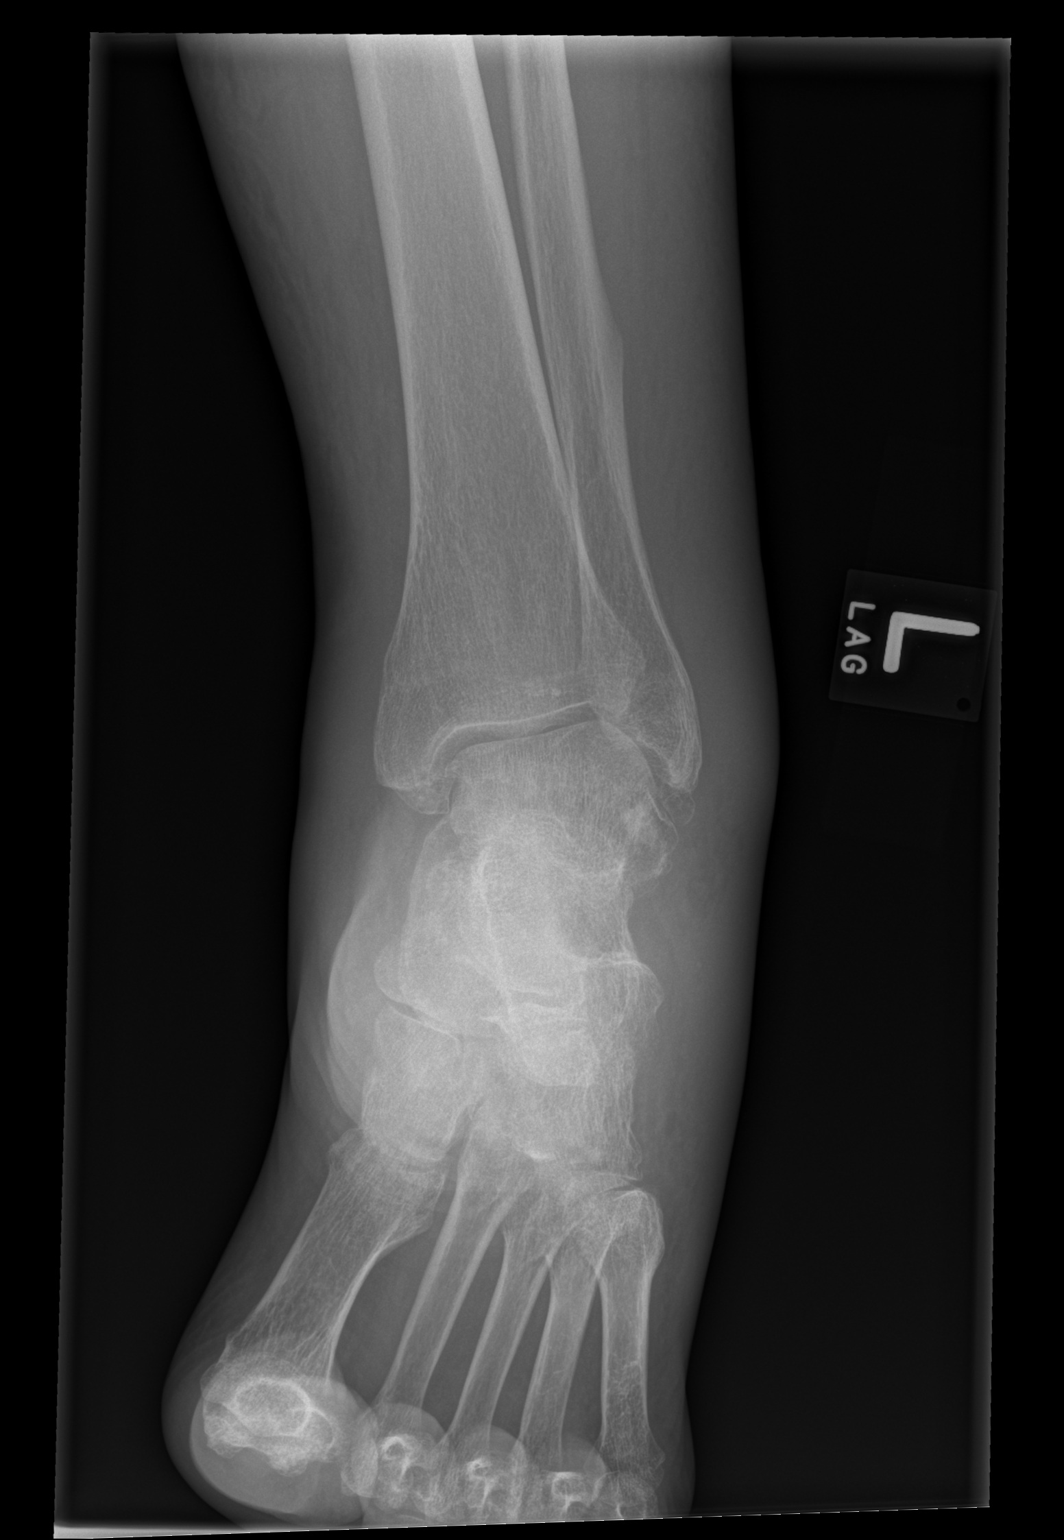

[x ankle obl left]
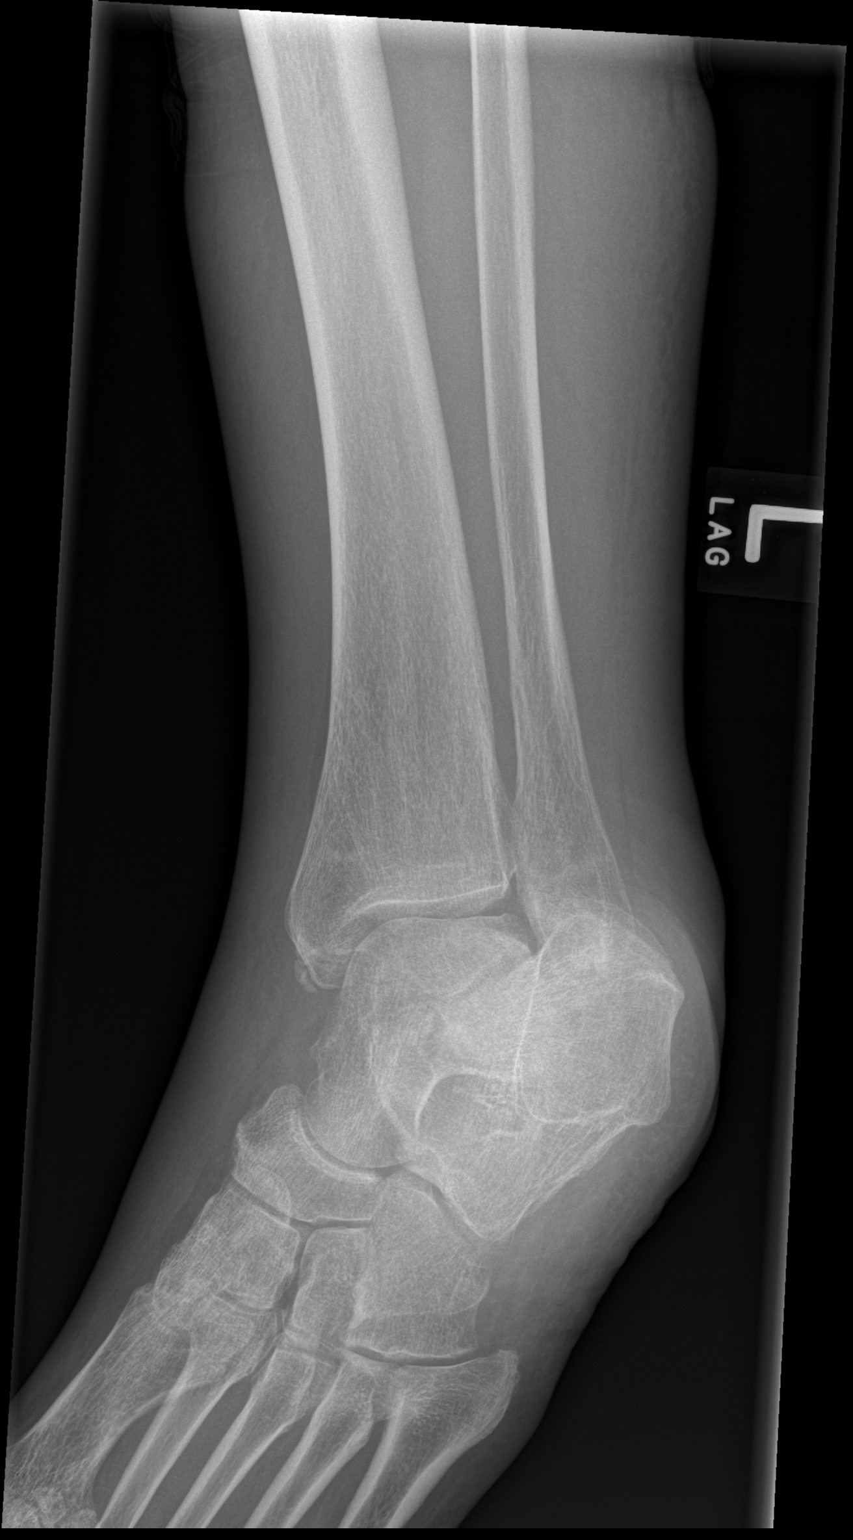

[x ankle lat left]
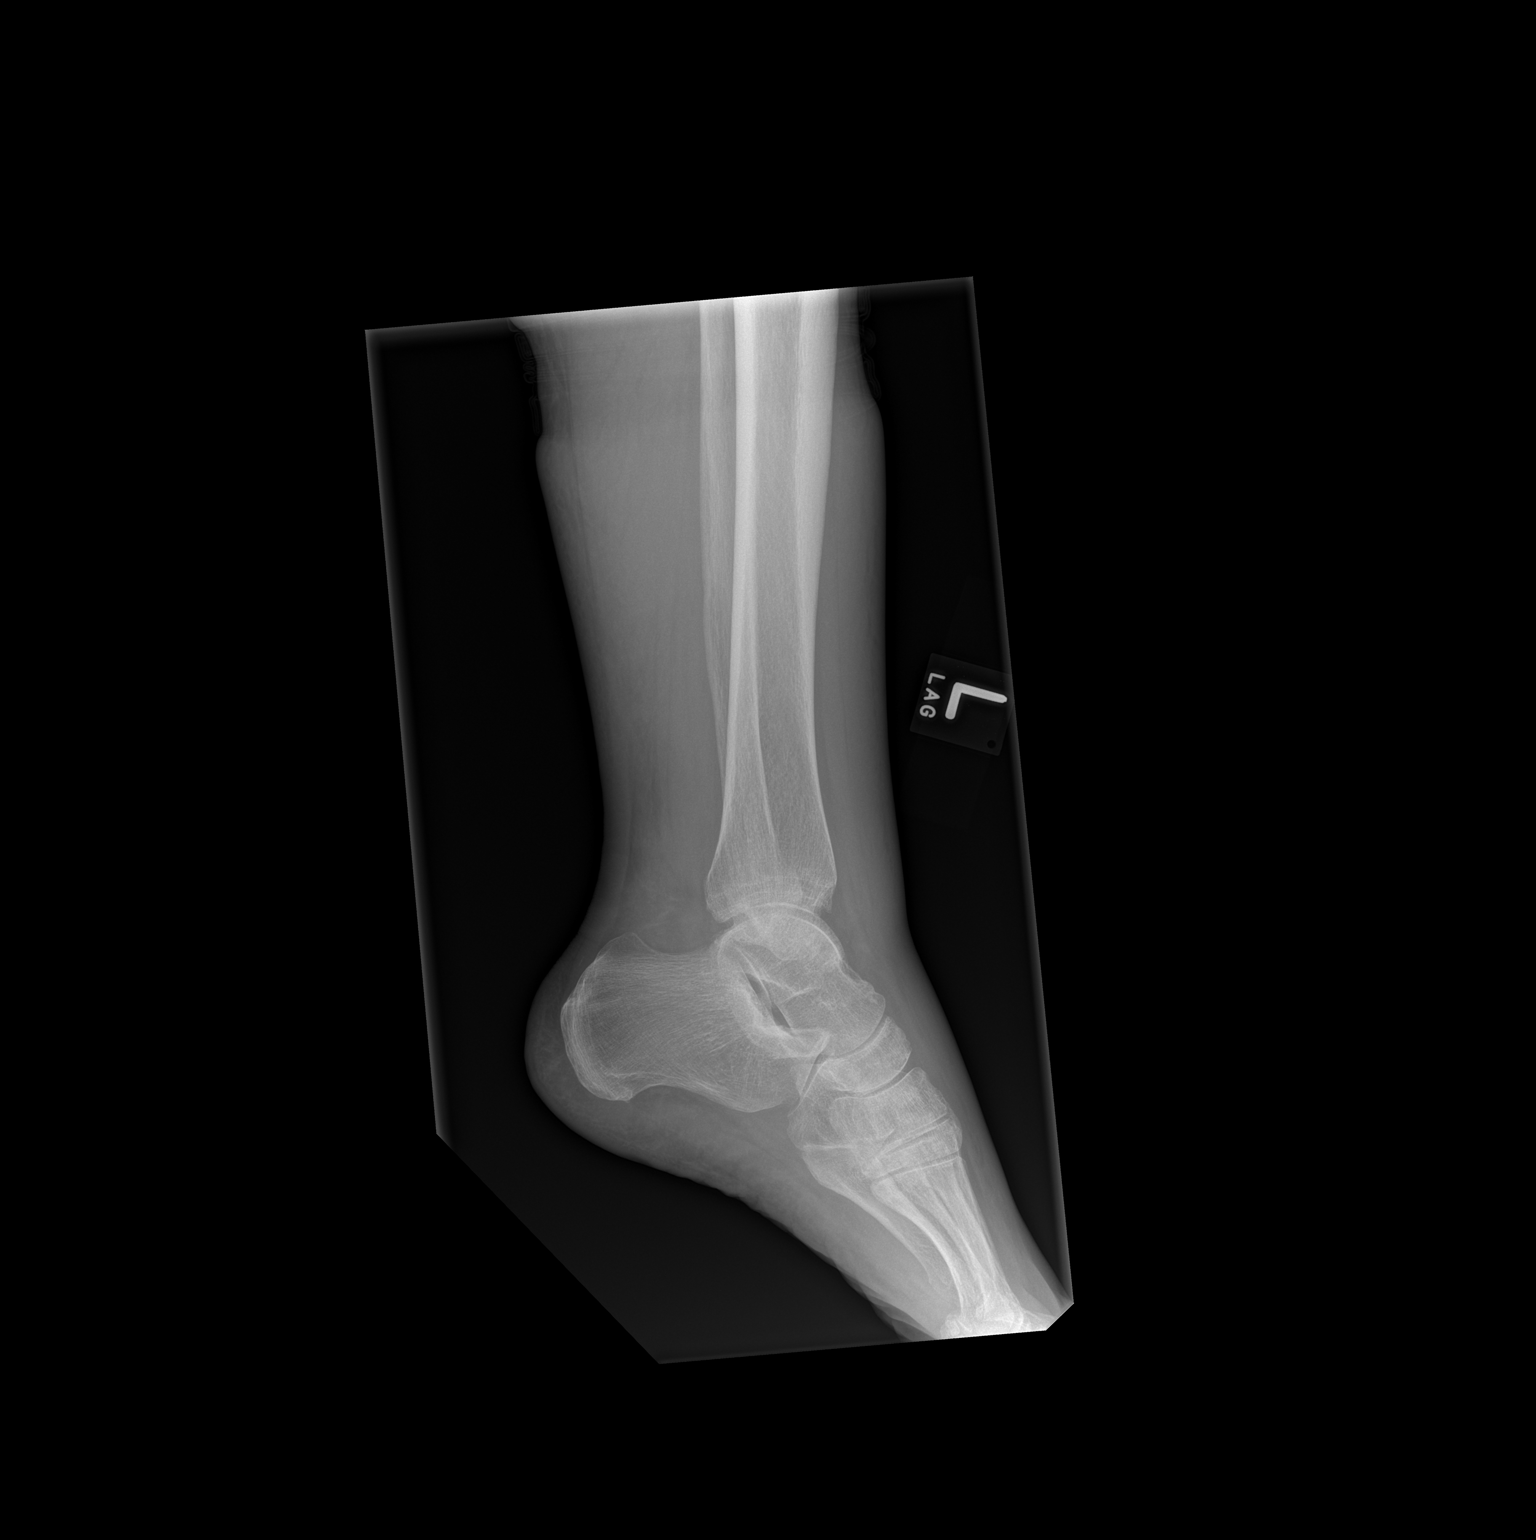

[3 of 3 positions shown; findings below may reference images not displayed]

FINDINGS: No fracture or dislocation is seen.

The ankle mortise is intact.

The base of the fifth metatarsal is unremarkable.

Mild diffuse soft tissue swelling.
IMPRESSION: No fracture or dislocation is seen.

Mild diffuse soft tissue swelling.

## 2019-07-13 IMAGING — CR DG CHEST 2V
3 series · 3 of 3 positions shown · non-contrast
Comparison: None.

CLINICAL DATA: Bilateral lower extremity swelling.

EXAM:
CHEST - 2 VIEW

[w chest lat (1 of 2)]
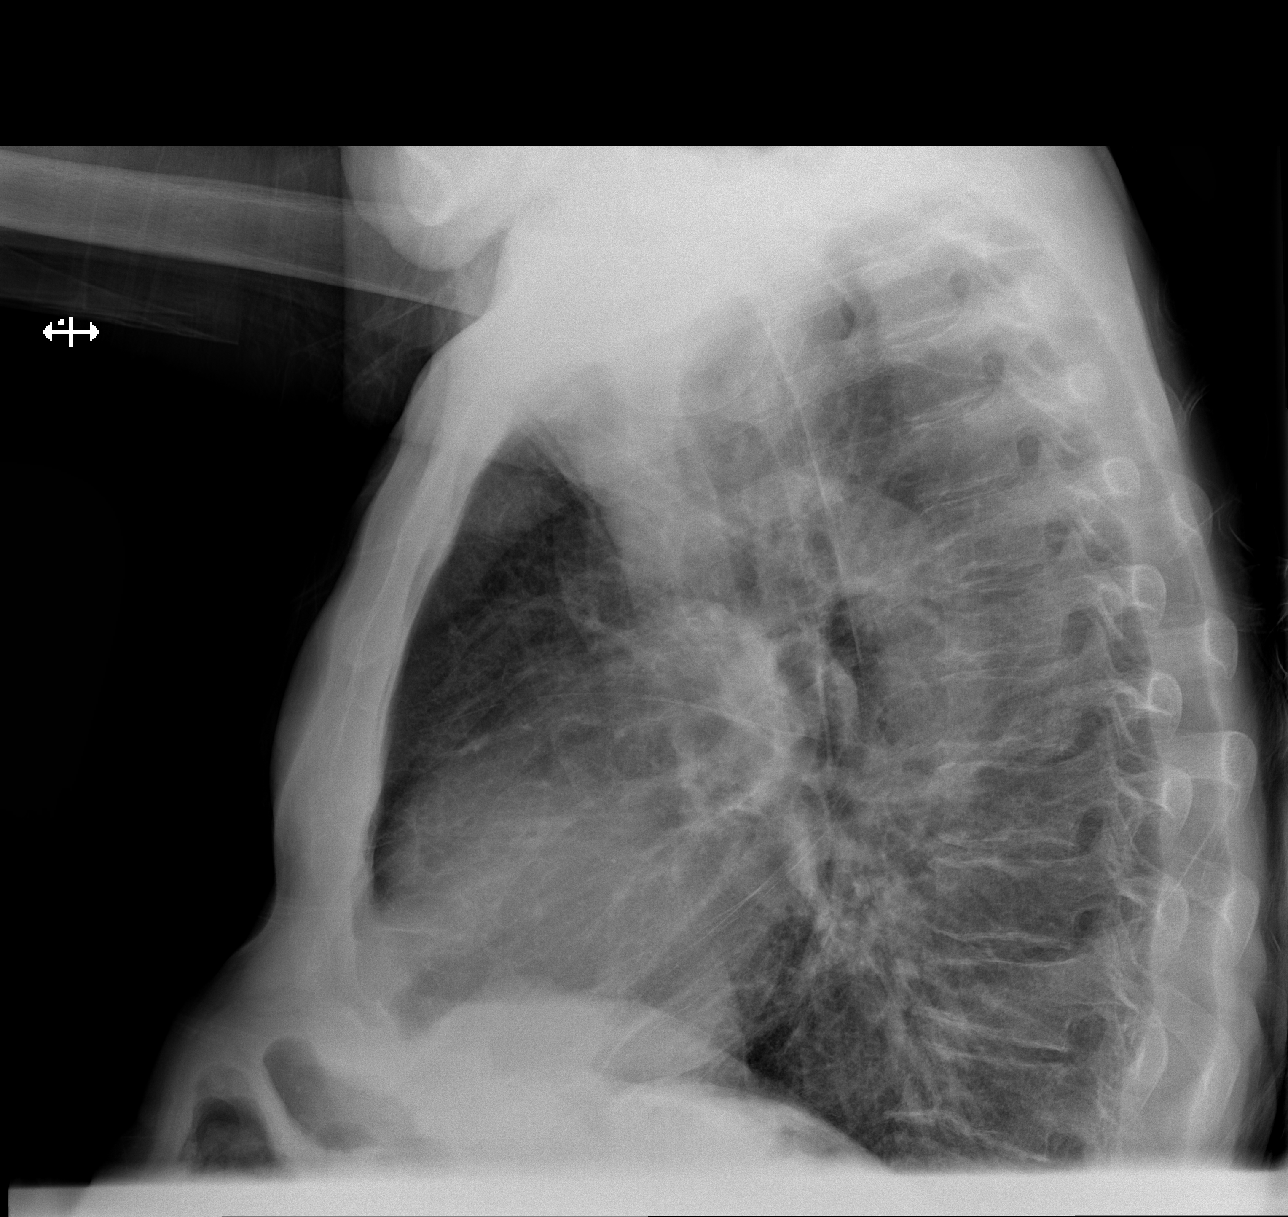

[w chest lat (2 of 2)]
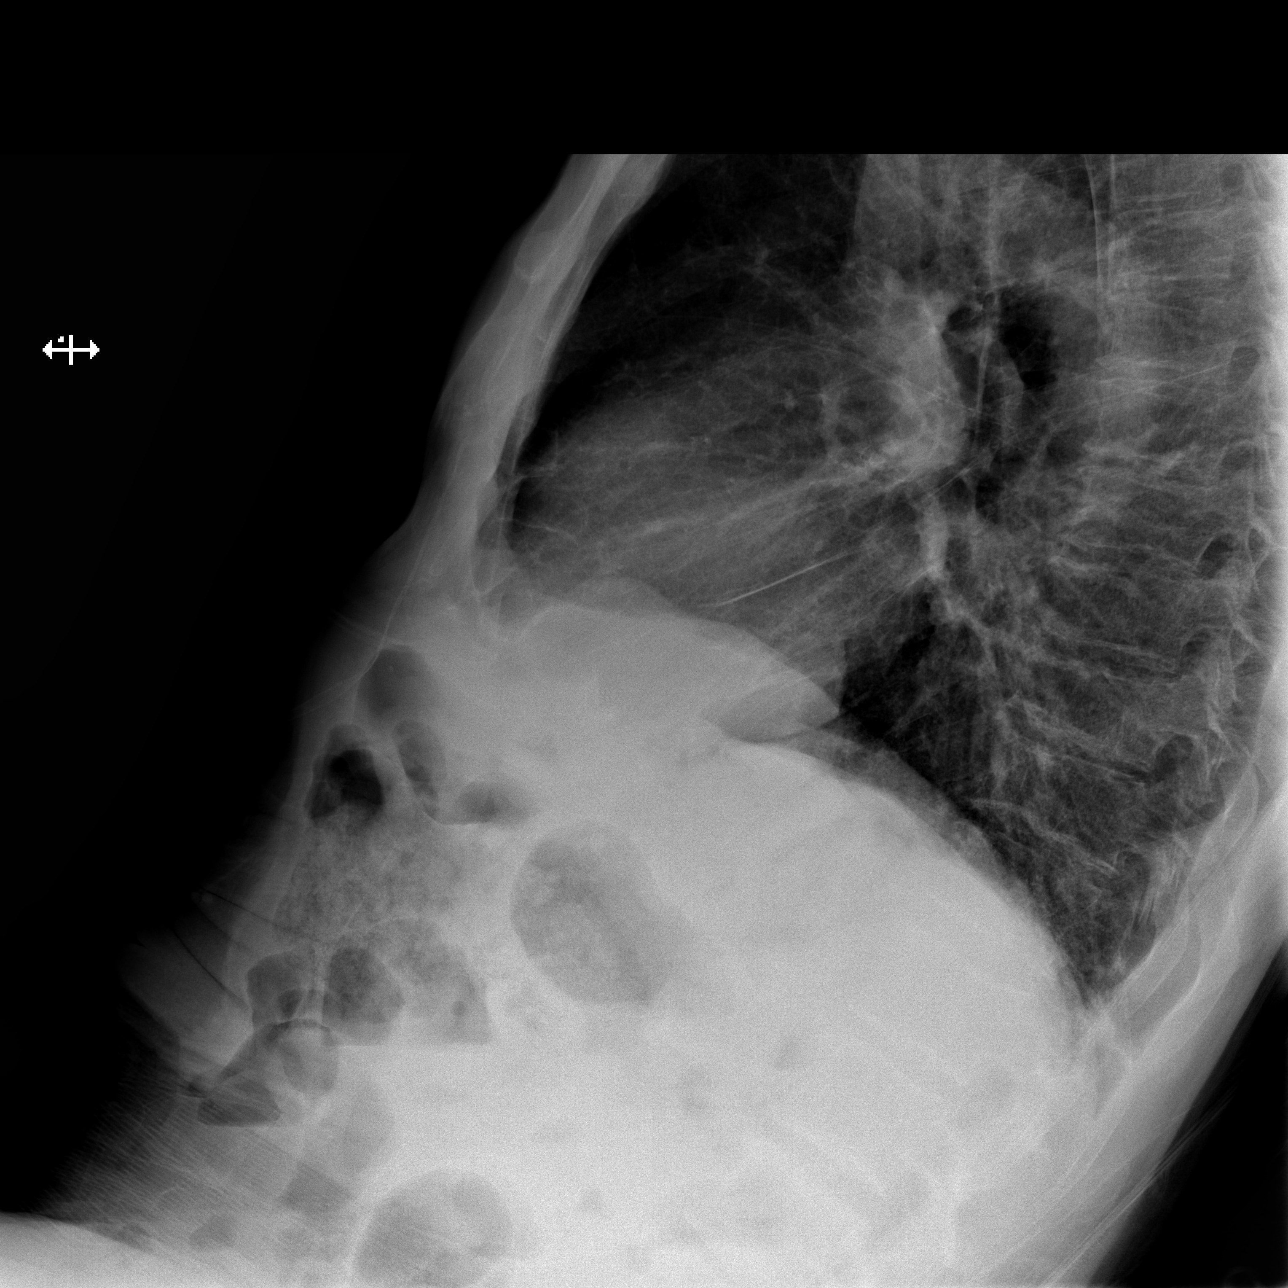

[x chest ap]
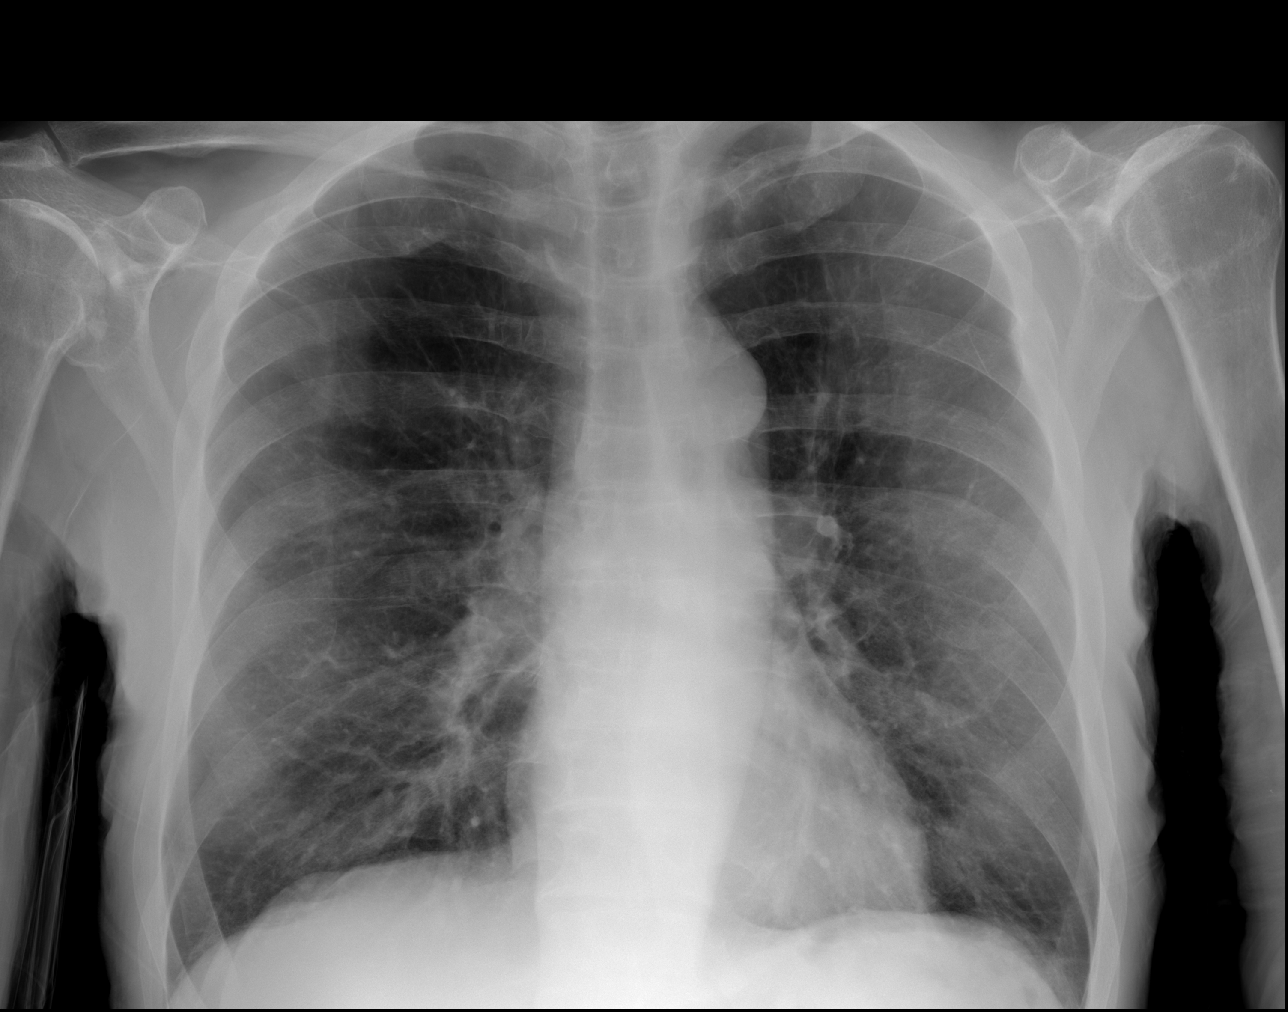

[3 of 3 positions shown; findings below may reference images not displayed]

FINDINGS: The cardiomediastinal silhouette is within normal limits. The lungs
are hyperinflated with mild interstitial coarsening. No confluent
airspace opacity, edema, pleural effusion, or pneumothorax is
identified. A lateral left fourth rib fracture is likely nonacute.
IMPRESSION: Suspected COPD without evidence of pneumonia or edema.

## 2020-04-17 ENCOUNTER — Ambulatory Visit
Admit: 2020-04-17 | Discharge: 2020-04-17 | Payer: MEDICARE | Attending: Nurse Practitioner | Primary: Nurse Practitioner

## 2020-04-17 DIAGNOSIS — G40919 Epilepsy, unspecified, intractable, without status epilepticus: Secondary | ICD-10-CM

## 2020-04-17 NOTE — Progress Notes (Signed)
Tim [name] DOB: Dec 11, 1955 Sex: male  Age: 64 y.o.    Chief Complaint   Patient presents with   ??? New Patient     est PCP       Assessment and Plan:    Tim [name] was seen today for new patient.    Diagnoses and all orders for this visit:    Intractable epilepsy without status epilepticus, unspecified epilepsy type ([place])  -     CBC Auto Differential; Future  -     Comprehensive Metabolic Panel, Fasting; Future  -     PHENYTOIN LEVEL, TOTAL; Future  -     CBC Auto Differential; Future  -     Comprehensive Metabolic Panel, Fasting; Future  -     PHENYTOIN LEVEL, TOTAL; Future    Screening for lipoid disorders  -     Lipid Panel; Future  -     Lipid Panel; Future    Screening for thyroid disorder  -     TSH without Reflex; Future    Screening for colorectal cancer  -     [place] - Ellen Henri, MD, General Surgery, [place]    Hemorrhoids, unspecified hemorrhoid type  -     [facility] - Ellen Henri, MD, General Surgery, [place]    Vitamin D deficiency    Encounter for screening for HIV  -     HIV Screen; Future    Encounter for prostate cancer screening  -     PSA screening; Future    Tobacco abuse  -     Urinalysis; Future    History of total knee arthroplasty, bilateral    History of total hip arthroplasty, right    Personal history of tobacco use  -     PR VISIT TO DISCUSS LUNG CA SCREEN W LDCT  -     CT Lung Screening; Future    Encounter for hepatitis C screening test for low risk patient  -     Hepatitis C Antibody; Future    History of simple renal cyst        USPTF:    (  ) Abnormal Blood Glucose and Type 2 Diabetes Mellitus: Screening -- Adults aged 44 to 71 years who are overweight or obese Grade: B (Recommended)    (  ) High Blood Pressure: Screening and Home Monitoring -- Adults  Grade: A (Recommended) recommends screening for high blood pressure in ages 20 years or older.   obtain measurements outside of the clinical setting for diagnostic confirmation before starting treatment. Annual screening for adults  aged 42 years or older or those who are at increased risk for blood pressure    (  ) Colorectal Cancer: Screening --Adults aged 62 to 62 years  Grade: A (Recommended) recommends screening for colorectal cancer starting at age 30 years and continuing until age 68 years.     (  ) HIV: Screening - Adolescents and Adults  Grade: A (Recommended) recommends that clinicians screen for HIV infection in ages 47 to 14 years.     (  ) Hepatitis C Virus Infection: Screening--Adults at High Risk and Adults born between 1945 and 1965  Grade: B (Recommended) recommends screening for hepatitis C virus (HCV) infection in persons at high risk for infection. The USPSTF also recommends offering 1-time screening for HCV infection to adults born between [birth year] and 1965.     (  )  Lipid Disorders in Adults: Screening -- Men 83 and Older  Grade: A (Recommended) recommends screening men aged 18 and older for lipid disorders.     (  ) Alcohol Misuse: Screening and Behavioral Counseling Interventions in Primary Care -- Adults  Grade: B (Recommended) recommends that clinicians screen adults aged 34 years or older for alcohol misuse and provide persons engaged in risky or hazardous drinking with brief behavioral counseling interventions to reduce alcohol misuse.     (  ) Abdominal Aortic Aneurysm: Screening -- Men Ages 103 to 69 Years Who Have Ever Smoked.  Grade: B (Recommended) recommends one-time screening for abdominal aortic aneurysm (AAA) with ultrasonography in men ages 77 to 44 years who have ever smoked.     (  )  Lung Cancer: Screening -- Adults Ages 71-80 who have a 30 pack-year smoking history and currently smoke or have quit within the past 15 years  Grade: B (Recommended) recommends annual screening for lung cancer with low-dose computed tomography (LDCT) in adults aged 37 to 105 years who have a 30 pack-year smoking history and currently smoke or have quit within the past 15 years. Screening should be discontinued once a person has  not smoked for 15 years or develops a health problem that substantially limits life expectancy or the ability or willingness to have curative lung surgery.     (  )  Obesity: Screening for and Management of-- All Adults  Grade: B(Recommended) recommends screening all adults for obesity. Clinicians should offer or refer patients with a body mass index (BMI) of 30 kg/m2 or higher to intensive, multicomponent behavioral interventions.        (Not a fall risk)  Fall Prevention -- Exercise/Physical Therapy: Community-dwelling Adults 38 Years or Older, Increased Risk for Falls   Grade: B (Recommended) recommends exercise or physical therapy to prevent falls in community-dwelling adults aged 82 years or older who are at increased risk for falls.    (No new symptoms noted or reported today)  Depression: Screening -- General adult population, including pregnant and postpartum women  Grade: B(Recommended) recommends screening for depression in the general adult population,  Screening should be implemented with adequate systems in place to ensure accurate diagnosis, effective treatment, and appropriate follow-up.    (  ) Glaucoma: Screening - Adults and Diabetic Eye Exam     (  ) Thyroid Dysfunction: Screening --      (  ) Prostate Cancer: Prostate-Specific Antigen (PSA)-Based Screening -- All Men  PSA     (  ) Vitamin D Deficiency: Screening --      (  ) Skin Cancer: Screening --Asymptomatic adults Grade: I(Uncertain)     (  ) Carotid Artery Stenosis: Screening -- Adults Grade: D (Not Recommended)     (  ) Coronary Heart Disease: Screening with Electrocardiography--Adults at Low Risk Grade: D (Not Recommended)       Educational materials / home exercises printed for patient's review and were included in patient instructions on his After Visit Summary and given to patient at the end of visit.       Counseled regarding above diagnosis, including possible risks and complications,  especially if left uncontrolled.     Counseled  regarding the possible side effects, risks, benefits and alternatives to treatment; patient and/or guardian verbalizes understanding, agrees, feels comfortable with and wishes to proceed with above treatment plan.     Advised patient to call with any new medication issues, and read all Rx info from pharmacy to assure aware of all possible risks  and side effects of medication before taking.     Reviewed age and gender appropriate health screening exams and vaccinations.  Advised patient regarding importance of keeping up with recommended health maintenance and to schedule as soon as possible if overdue, as this is important in assessing for undiagnosed pathology, especially cancer, as well as protecting against potentially harmful/life threatening disease.       Patient verbalizes understanding and agrees with above counseling, assessment and plan.     All questions answered.    On 04/17/20 I have spent 30 reviewing previous notes, test results and face to face with the patient discussing the diagnosis and importance of compliance with the treatment plan as well as documenting on the day of the visit.  Educational materials exercises printed for patient's review and were included in patient instructions on their After Visit Summary and given to patient at the end of visit. ??    Return in about 6 months (around 10/15/2020) for Routine Visit with Labs.    This is a very pleasant 64 year old who is here to establish as a new patient and presents today for evaluation and management of chronic medical problems. Current medication list reviewed. The patient is tolerating all medications well without adverse events or known side effects. The patient does understand the risk and benefits of the prescribed medications. The patient is not up-to-date on all age-appropriate wellness issues.  Patient denies any reccent hospitalizations or ER visit.      No Acute Complaints reported: None reported       Review of Systems    Constitutional: Negative for activity change, chills, diaphoresis, fatigue, fever and unexpected weight change.   HENT: Negative for trouble swallowing and voice change.    Eyes: Negative for visual disturbance.   Respiratory: Negative for cough, chest tightness, shortness of breath and wheezing.    Cardiovascular: Negative for chest pain, palpitations and leg swelling.   Gastrointestinal: Negative for abdominal pain, blood in stool, constipation, diarrhea, nausea and vomiting.   Endocrine: Negative for polydipsia, polyphagia and polyuria.   Genitourinary: Negative for dysuria, enuresis, frequency and hematuria.   Musculoskeletal: Negative for arthralgias, back pain, gait problem, joint swelling, myalgias and neck stiffness.   Skin: Negative for rash.   Neurological: Negative for dizziness, seizures, syncope, facial asymmetry, weakness, light-headedness, numbness and headaches.   Hematological: Does not bruise/bleed easily.   Psychiatric/Behavioral: Negative for behavioral problems, confusion, hallucinations and suicidal ideas. The patient is not nervous/anxious.          Current Outpatient Medications:   ???  omeprazole (PRILOSEC) 40 MG delayed release capsule, omeprazole 40 mg capsule,delayed release, Disp: , Rfl:   ???  phenytoin (DILANTIN) 100 MG ER capsule, phenytoin sodium extended 100 mg capsule  take 1 capsule by mouth three times a day, Disp: , Rfl:   Allergies   Allergen Reactions   ??? Latex    ??? Red Dye    ??? Tramadol        Past Medical History:   Diagnosis Date   ??? Seizures [facility])      Past Surgical History:   Procedure Laterality Date   ??? JOINT REPLACEMENT Right 08/15/2014   ??? JOINT REPLACEMENT Left 08/16/2003   ??? JOINT REPLACEMENT Right 08/15/2009     Family History   Problem Relation Age of Onset   ??? Cancer Mother    ??? Cancer Father    ??? Heart Disease Father      Social History  Socioeconomic History   ??? Marital status: Divorced     Spouse name: Not on file   ??? Number of children: Not on file   ??? Years  of education: Not on file   ??? Highest education level: Not on file   Occupational History   ??? Not on file   Tobacco Use   ??? Smoking status: Current Every Day Smoker     Packs/day: 0.50     Years: 70.00     Pack years: 35.00     Types: Cigarettes     Start date: 01/24/1971   ??? Smokeless tobacco: Never Used   Substance and Sexual Activity   ??? Alcohol use: Never   ??? Drug use: Never   ??? Sexual activity: Not on file   Other Topics Concern   ??? Not on file   Social History Narrative   ??? Not on file     Social Determinants of Health     Financial Resource Strain: High Risk   ??? Difficulty of Paying Living Expenses: Very hard   Food Insecurity: No Food Insecurity   ??? Worried About Charity fundraiser in the Last Year: Never true   ??? Ran Out of Food in the Last Year: Never true   Transportation Needs:    ??? Lack of Transportation (Medical):    ??? Lack of Transportation (Non-Medical):    Physical Activity:    ??? Days of Exercise per Week:    ??? Minutes of Exercise per Session:    Stress:    ??? Feeling of Stress :    Social Connections:    ??? Frequency of Communication with Friends and Family:    ??? Frequency of Social Gatherings with Friends and Family:    ??? Attends Religious Services:    ??? Marine scientist or Organizations:    ??? Attends Archivist Meetings:    ??? Marital Status:    Intimate Production manager Violence:    ??? Fear of Current or Ex-Partner:    ??? Emotionally Abused:    ??? Physically Abused:    ??? Sexually Abused:        Vitals:    04/17/20 0935   BP: 122/78   Pulse: 57   Temp: 98.7 ??F (37.1 ??C)   SpO2: 99%   Weight: 148 lb (67.1 kg)   Height: _0  (1.88 m)       Physical Exam  Vitals and nursing note reviewed.   Constitutional:       Appearance: Normal appearance.   HENT:      Head: Normocephalic.      Right Ear: Tympanic membrane and ear canal normal. There is no impacted cerumen.      Left Ear: Tympanic membrane and ear canal normal. There is no impacted cerumen.      Nose: Nose normal.      Mouth/Throat:      Mouth:  Mucous membranes are dry.   Eyes:      Extraocular Movements: Extraocular movements intact.      Pupils: Pupils are equal, round, and reactive to light.   Neck:      Vascular: No carotid bruit.   Cardiovascular:      Rate and Rhythm: Normal rate and regular rhythm.      Pulses: Normal pulses.      Heart sounds: Normal heart sounds. No murmur heard.   No friction rub. No gallop.    Pulmonary:  Effort: Pulmonary effort is normal. No respiratory distress.      Breath sounds: Normal breath sounds. No stridor. No wheezing, rhonchi or rales.   Chest:      Chest wall: No tenderness.   Abdominal:      General: Bowel sounds are normal. There is no distension.      Palpations: Abdomen is soft.   Musculoskeletal:         General: No swelling, tenderness, deformity or signs of injury.      Cervical back: No rigidity. No muscular tenderness.      Right lower leg: No edema.      Left lower leg: No edema.   Lymphadenopathy:      Cervical: No cervical adenopathy.   Skin:     General: Skin is warm and dry.      Capillary Refill: Capillary refill takes 2 to 3 seconds.      Findings: No bruising, lesion or rash.   Neurological:      General: No focal deficit present.      Mental Status: He is alert and oriented to person, place, and time.      Motor: Weakness present.      Gait: Gait abnormal.      Comments: Uses a walker   Psychiatric:         Attention and Perception: Attention normal.         Mood and Affect: Mood normal.         Behavior: Behavior normal.         Thought Content: Thought content does not include homicidal or suicidal ideation. Thought content does not include homicidal or suicidal plan.                Seen By:  Will Bonnet, APRN - CNP    Low Dose CT (LDCT) Lung Screening criteria met   Age 70-77   Pack year smoking >30   Still smoking or less than 15 year since quit   No sign or symptoms of lung cancer   > 11 months since last LDCT     Risks and benefits of??lung cancer screening with LDCT scans  discussed:    Significance of positive screen - False-positive LDCT results often occur.??95% of all positive results do not lead to a diagnosis of cancer. Usually further imaging can resolve most false-positive results; however, some patients may require invasive procedures.    Over diagnosis risk - 10% to 12% of screen-detected lung cancer cases are over diagnosed--that is, the cancer would not have been detected in the patient's lifetime without the screening.    Need for follow up screens annually to continue lung cancer screening effectiveness     Risks associated with radiation from annual LDCT- Radiation exposure is about the same as for a mammogram, which is about 1/3 of the annual background radiation exposure from everyday life.  Starting screening at age 32 is not likely to increase cancer risk from radiation exposure.    Patients with comorbidities resulting in life expectancy of < 10 years, or that would preclude treatment of an abnormality identified on CT, should not be screened due to lack of benefit.    To obtain maximal benefit from this screening, smoking cessation and long-term abstinence from smoking is critical

## 2020-04-17 NOTE — Patient Instructions (Signed)
What is lung cancer screening?  Lung cancer screening is a way in which doctors check the lungs for early signs of cancer in people who have no symptoms of lung cancer.  A low-dose CT scan uses much less radiation than a normal CT scan and shows a more detailed image of the lungs than a standard X-ray.  The goal of lung cancer screening is to find cancer early, before it has a chance to grow, spread, or cause problems.  One large study found that smokers who were screened with low-dose CT scans were less likely to die of lung cancer than those who were screened with standard X-ray.    Below is a summary of the things you need to know regarding screening for lung cancer with low-dose computed tomography (LDCT).  This is a screening program that involves routine annual screening with LDCT studies of the lung.  The LDCTs are done using low-dose radiation that is not thought to increase your cancer risk.  If you have other serious medical conditions (other cancers, congestive heart failure) that limit your life expectancy to less than 10 years, you should not undergo lung cancer screening with LDCT.  The chance is 20%-60% that the LDCT result will show abnormalities.  This would require additional testing which could include repeat imaging or even invasive procedures.  Most (about 95%) of "abnormal" LDCT results are false in the sense that no lung cancer is ultimately found.  Additionally, some (about 10%) of the cancers found would not affect your life expectancy, even if undetected and untreated.  If you are still smoking, the single most important thing that you can do to reduce your risk of dying of lung cancer is to quit.    For this screening to be covered by Medicare and most other insurers, strict criteria must be met.  If you do not meet these criteria, but still wish to undergo LDCT testing, you will be required to sign a waiver indicating your willingness to pay for the scan.

## 2020-04-22 ENCOUNTER — Encounter: Admit: 2020-04-22 | Discharge: 2020-04-22 | Payer: MEDICARE | Primary: Nurse Practitioner

## 2020-04-22 ENCOUNTER — Encounter

## 2020-04-23 LAB — COMPREHENSIVE METABOLIC PANEL, FASTING
ALT: 10 U/L (ref 0–40)
AST: 17 U/L (ref 0–39)
Albumin: 3.7 g/dL (ref 3.5–5.2)
Alkaline Phosphatase: 221 U/L — ABNORMAL HIGH (ref 40–129)
Anion Gap: 9 mmol/L (ref 7–16)
BUN: 8 mg/dL (ref 6–23)
CO2: 24 mmol/L (ref 22–29)
Calcium: 8.6 mg/dL (ref 8.6–10.2)
Chloride: 109 mmol/L — ABNORMAL HIGH (ref 98–107)
Creatinine: 0.8 mg/dL (ref 0.7–1.2)
GFR African American: >60
GFR Non-African American: >60 mL/min/{1.73_m2} (ref >=60)
Glucose, Fasting: 96 mg/dL (ref 74–99)
Potassium: 3.8 mmol/L (ref 3.5–5.0)
Sodium: 142 mmol/L (ref 132–146)
Total Bilirubin: 0.2 mg/dL (ref 0.0–1.2)
Total Protein: 7 g/dL (ref 6.4–8.3)

## 2020-04-23 LAB — PHENYTOIN LEVEL, TOTAL: Phenytoin Lvl: 2.7 ug/mL — ABNORMAL LOW (ref 10.0–20.0)

## 2020-04-23 LAB — HIV SCREEN: HIV-1/HIV-2 Ab: NONREACTIVE

## 2020-04-23 LAB — CBC WITH AUTO DIFFERENTIAL
Basophils %: 0.6 % (ref 0.0–2.0)
Basophils Absolute: 0.04 E9/L (ref 0.00–0.20)
Eosinophils %: 2.9 % (ref 0.0–6.0)
Eosinophils Absolute: 0.18 E9/L (ref 0.05–0.50)
Hematocrit: 39.9 % (ref 37.0–54.0)
Hemoglobin: 13.3 g/dL (ref 12.5–16.5)
Immature Granulocytes #: 0.04 E9/L
Immature Granulocytes %: 0.6 % (ref 0.0–5.0)
Lymphocytes %: 25.8 % (ref 20.0–42.0)
Lymphocytes Absolute: 1.61 E9/L (ref 1.50–4.00)
MCH: 32.3 pg (ref 26.0–35.0)
MCHC: 33.3 % (ref 32.0–34.5)
MCV: 96.8 fL (ref 80.0–99.9)
MPV: 10.2 fL (ref 7.0–12.0)
Monocytes %: 9 % (ref 2.0–12.0)
Monocytes Absolute: 0.56 E9/L (ref 0.10–0.95)
Neutrophils %: 61.1 % (ref 43.0–80.0)
Neutrophils Absolute: 3.81 E9/L (ref 1.80–7.30)
Platelets: 260 E9/L (ref 130–450)
RBC: 4.12 E12/L (ref 3.80–5.80)
RDW: 13.8 fL (ref 11.5–15.0)
WBC: 6.2 E9/L (ref 4.5–11.5)

## 2020-04-23 LAB — LIPID PANEL
Cholesterol, Total: 117 mg/dL (ref 0–199)
HDL: 32 mg/dL (ref >40)
LDL Calculated: 68 mg/dL (ref 0–99)
Triglycerides: 83 mg/dL (ref 0–149)
VLDL Cholesterol Calculated: 17 mg/dL

## 2020-04-23 LAB — HEPATITIS C ANTIBODY: Hep C Ab Interp: NONREACTIVE

## 2020-04-23 LAB — PSA SCREENING: PSA: 1.52 ng/mL (ref 0.00–4.00)

## 2020-04-23 LAB — TSH: TSH: 2.2 u[IU]/mL (ref 0.270–4.200)

## 2020-05-06 ENCOUNTER — Ambulatory Visit: Admit: 2020-05-06 | Discharge: 2020-05-06 | Payer: MEDICARE | Attending: Surgery | Primary: Nurse Practitioner

## 2020-05-06 DIAGNOSIS — K625 Hemorrhage of anus and rectum: Secondary | ICD-10-CM

## 2020-05-06 NOTE — Patient Instructions (Signed)
 GATORADE COLONOSCOPY PREPARATION    Instructions for Clear liquid diet  Definition  A clear liquid diet consists of clear liquids, such as water, broth and plain gelatin, that are easily digested and leave no undigested residue in your intestinal tract. Your doctor may prescribe a clear liquid diet before certain medical procedures or if you have certain digestive problems. Because a clear liquid diet can't provide you with adequate calories and nutrients, it shouldn't be continued for more than a few days.     Purpose  A clear liquid diet is often used before tests, procedures or surgeries that require no food in your stomach or intestines, such as before colonoscopy. It may also be recommended as a short-term diet if you have certain digestive problems, such as nausea, vomiting or diarrhea, or after certain types of surgery.     Diet details  A clear liquid diet helps maintain adequate hydration, provides some important electrolytes, such as sodium and potassium, and gives some energy at a time when a full diet isn't possible or recommended.     The following foods are allowed in a clear liquid diet:   . Plain water   . Fruit juices without pulp, such as apple juice, white grape juice.  . Strained lemonade   . Clear, fat-free broth (bouillon or consomme)   . Clear sodas    . Plain gelatin (Not RED or PURPLE)  . Honey   . Ice pops without bits of fruit or fruit pulp   . Tea or coffee without milk or cream  . ** NOTHING RED OR PURPLE    Any foods not on the above list should be avoided. Also, for certain tests, such as colon exams, your doctor may ask you to avoid liquids or gelatin with red coloring.     A typical menu on the clear liquid diet may look like this:   Breakfast:  1 glass fruit juice  1 cup coffee or tea (without dairy products)  1 cup broth  1 bowl gelatin     Snack:  1 glass fruit juice  1 bowl gelatin     Lunch:  1 glass fruit juice  1 glass water  1 cup broth  1 bowl gelatin     Snack:  1 ice pop  (without fruit pulp)  1 cup coffee or tea (without dairy products) or a soft drink     Dinner:  1 cup juice or water  1 cup broth  1 bowl gelatin  1 cup coffee or tea     Purchase this over the counter:  1. GATORADE/Clear liquid (64 ounces)   Pick these up at the pharmacy:   MIRALAX  BOTTLE 238 grams (over the counter only)    The DAY BEFORE your colonoscopy:   Drink only clear liquids. (Absolutely no solid food)    COLONOSCOPY PREP:  3 PM: Mix the entire bottle of MIRALAX  into the 64 ounces of GATORADE. (Put half the bottle in each 32 ounce bottle). Shake the solution until fully dissolved. Drink an 8 ounce glass every 30 minutes until the solution is gone.        **DO NOT EAT OR DRINK ANYTHING AFTER MIDNIGHT**          The DAY OF your colonoscopy:  You may take any necessary medications with a sip of water.  Bring along someone to take you home.    REMEMBER: The preparation is very important. An adequate clean out allows for the  best evaluation of your entire colon. During the prep, using baby wipes may ease some of your discomfort.    You should NOT plan on working or driving the rest of the day due to sedation given at the procedure

## 2020-05-06 NOTE — Progress Notes (Signed)
Consult Note    Dear Dr.Giuseppe Deabate, APRN - CNP, thank you for referring Tim [name] for evaluation.      Reason for Consult: Colonoscopy, hemorrhoids    HISTORY OF PRESENT ILLNESS:    The patient is a 64 y.o. male who presents with complaints of change in his bowels.  Reports is been ongoing for the last 5 years or so.  He reports alternating diarrhea and constipation.  He also has occasional rectal bleeding.  He also states he feels very large hemorrhoids.  He does have history of colon polyps on previous colonoscopy.  There is no family history of colon cancer but he states his brother died of bone cancer.  He does do a lot of heavy lifting regularly.      Past Medical History:   Diagnosis Date   ??? Seizures [facility])        Past Surgical History:   Procedure Laterality Date   ??? JOINT REPLACEMENT Right 08/15/2014   ??? JOINT REPLACEMENT Left 08/16/2003   ??? JOINT REPLACEMENT Right 08/15/2009       Prior to Admission medications    Medication Sig Start Date End Date Taking? Authorizing Provider   omeprazole (PRILOSEC) 40 MG delayed release capsule omeprazole 40 mg capsule,delayed release    Historical Provider, MD   phenytoin (DILANTIN) 100 MG ER capsule phenytoin sodium extended 100 mg capsule   take 1 capsule by mouth three times a day    Historical Provider, MD       Scheduled Meds:  ContinuousInfusions:  PRN Meds:    Allergies   Allergen Reactions   ??? Latex    ??? Red Dye    ??? Tramadol        Social History     Socioeconomic History   ??? Marital status: Divorced     Spouse name: Not on file   ??? Number of children: Not on file   ??? Years of education: Not on file   ??? Highest education level: Not on file   Occupational History   ??? Not on file   Tobacco Use   ??? Smoking status: Current Every Day Smoker     Packs/day: 0.50     Years: 70.00     Pack years: 35.00     Types: Cigarettes     Start date: 01/24/1971   ??? Smokeless tobacco: Never Used   Substance and Sexual Activity   ??? Alcohol use: Never   ??? Drug use: Never   ???  Sexual activity: Not on file   Other Topics Concern   ??? Not on file   Social History Narrative   ??? Not on file     Social Determinants of Health     Financial Resource Strain: High Risk   ??? Difficulty of Paying Living Expenses: Very hard   Food Insecurity: No Food Insecurity   ??? Worried About Programme researcher, broadcasting/film/video in the Last Year: Never true   ??? Ran Out of Food in the Last Year: Never true   Transportation Needs:    ??? Lack of Transportation (Medical):    ??? Lack of Transportation (Non-Medical):    Physical Activity:    ??? Days of Exercise per Week:    ??? Minutes of Exercise per Session:    Stress:    ??? Feeling of Stress :    Social Connections:    ??? Frequency of Communication with Friends and Family:    ??? Frequency of Social  Gatherings with Friends and Family:    ??? Attends Religious Services:    ??? Database administrator or Organizations:    ??? Attends Engineer, structural:    ??? Marital Status:    Intimate Programme researcher, broadcasting/film/video Violence:    ??? Fear of Current or Ex-Partner:    ??? Emotionally Abused:    ??? Physically Abused:    ??? Sexually Abused:        Family History   Problem Relation Age of Onset   ??? Cancer Mother    ??? Cancer Father    ??? Heart Disease Father        Review Of Systems:   Review of Systems   Constitutional: Negative for chills and fever.   HENT: Negative for ear pain, nosebleeds, sore throat and tinnitus.    Eyes: Negative for photophobia and redness.   Respiratory: Negative for cough, shortness of breath and wheezing.    Cardiovascular: Negative for chest pain and palpitations.   Gastrointestinal: Positive for anal bleeding, blood in stool, constipation and diarrhea. Negative for abdominal pain, nausea and vomiting.   Endocrine: Negative for polydipsia.   Genitourinary: Negative for dysuria, hematuria and urgency.   Musculoskeletal: Negative for back pain and neck pain.   Skin: Negative for rash.   Neurological: Negative for dizziness, tremors and seizures.   Hematological: Does not bruise/bleed easily.   All  other systems reviewed and are negative.       Physical Exam:  Vitals:    05/06/20 1317   BP: 118/66   Site: Left Upper Arm   Pulse: 72   Temp: 97.2 ??F (36.2 ??C)   TempSrc: Temporal   Weight: 145 lb (65.8 kg)   Height: 6\' 2"  (1.88 m)       General: Well nourished, well developed, no acute distress  Eyes:  PERRL   Conjunctiva unremarkable   ENT:  TM's intact bilaterally, no effusion   Nasal:  No mucosal edema     No nasal drainage   Oral:  mucosa moist and pink   Neck:  Supple   No palpable cervical lymphoadenopathy   Thyroid without mass or enlargement  Resp: Lungs CTAB   Equal and adequate air exchange without accessory muscle use   No rales, rhonchi or wheeze  CV: S1S2 RRR   No murmur   Intact distal pulses   No edema  GI: Abdomen Soft, non tender, non distended   Normoactive bowel sounds   No palpable hepatosplenomegaly on rectal exam, there were moderate hemorrhoids identified.  MS: Physiologic ROM of all extremities    Intact distal pulses   No clubbing or cyanosis   Skin Warm and dry; no rash or lesion  Neuro: Alert and oriented; normal and intact dtr's   Psych: Euthymic mood, congruent affect      No results found.     Assessment/Plan:  38. 64 year old male with change in bowel habits, diarrhea, constipation, history of colon polyps, rectal bleeding.  We will plan initially for colonoscopy.     The risks and benefits of the procedure were explained to the patient, including risks of bleeding and perforation.  The patient expressed understanding of these risks.        Electronically signed by 65, DO on 05/06/20 at 1:30 PM EDT

## 2020-05-06 NOTE — H&P (Signed)
HISTORY OF PRESENT ILLNESS:    The patient is a 64 y.o. male who presents with complaints of change in his bowels.  Reports is been ongoing for the last 5 years or so.  He reports alternating diarrhea and constipation.  He also has occasional rectal bleeding.  He also states he feels very large hemorrhoids.  He does have history of colon polyps on previous colonoscopy.  There is no family history of colon cancer but he states his brother died of bone cancer.  He does do a lot of heavy lifting regularly.      Past Medical History:   Diagnosis Date   ??? Seizures [facility])        Past Surgical History:   Procedure Laterality Date   ??? JOINT REPLACEMENT Right 08/15/2014   ??? JOINT REPLACEMENT Left 08/16/2003   ??? JOINT REPLACEMENT Right 08/15/2009       Prior to Admission medications    Medication Sig Start Date End Date Taking? Authorizing Provider   omeprazole (PRILOSEC) 40 MG delayed release capsule omeprazole 40 mg capsule,delayed release    Historical Provider, MD   phenytoin (DILANTIN) 100 MG ER capsule phenytoin sodium extended 100 mg capsule   take 1 capsule by mouth three times a day    Historical Provider, MD       Scheduled Meds:  ContinuousInfusions:  PRN Meds:    Allergies   Allergen Reactions   ??? Latex    ??? Red Dye    ??? Tramadol        Social History     Socioeconomic History   ??? Marital status: Divorced     Spouse name: Not on file   ??? Number of children: Not on file   ??? Years of education: Not on file   ??? Highest education level: Not on file   Occupational History   ??? Not on file   Tobacco Use   ??? Smoking status: Current Every Day Smoker     Packs/day: 0.50     Years: 70.00     Pack years: 35.00     Types: Cigarettes     Start date: 01/24/1971   ??? Smokeless tobacco: Never Used   Substance and Sexual Activity   ??? Alcohol use: Never   ??? Drug use: Never   ??? Sexual activity: Not on file   Other Topics Concern   ??? Not on file   Social History Narrative   ??? Not on file     Social Determinants of Health     Financial  Resource Strain: High Risk   ??? Difficulty of Paying Living Expenses: Very hard   Food Insecurity: No Food Insecurity   ??? Worried About Programme researcher, broadcasting/film/video in the Last Year: Never true   ??? Ran Out of Food in the Last Year: Never true   Transportation Needs:    ??? Lack of Transportation (Medical):    ??? Lack of Transportation (Non-Medical):    Physical Activity:    ??? Days of Exercise per Week:    ??? Minutes of Exercise per Session:    Stress:    ??? Feeling of Stress :    Social Connections:    ??? Frequency of Communication with Friends and Family:    ??? Frequency of Social Gatherings with Friends and Family:    ??? Attends Religious Services:    ??? Database administrator or Organizations:    ??? Attends Banker Meetings:    ???  Marital Status:    Intimate Partner Violence:    ??? Fear of Current or Ex-Partner:    ??? Emotionally Abused:    ??? Physically Abused:    ??? Sexually Abused:        Family History   Problem Relation Age of Onset   ??? Cancer Mother    ??? Cancer Father    ??? Heart Disease Father        Review Of Systems:   Review of Systems   Constitutional: Negative for chills and fever.   HENT: Negative for ear pain, nosebleeds, sore throat and tinnitus.    Eyes: Negative for photophobia and redness.   Respiratory: Negative for cough, shortness of breath and wheezing.    Cardiovascular: Negative for chest pain and palpitations.   Gastrointestinal: Positive for anal bleeding, blood in stool, constipation and diarrhea. Negative for abdominal pain, nausea and vomiting.   Endocrine: Negative for polydipsia.   Genitourinary: Negative for dysuria, hematuria and urgency.   Musculoskeletal: Negative for back pain and neck pain.   Skin: Negative for rash.   Neurological: Negative for dizziness, tremors and seizures.   Hematological: Does not bruise/bleed easily.   All other systems reviewed and are negative.       Physical Exam:  Vitals:    05/06/20 1317   BP: 118/66   Site: Left Upper Arm   Pulse: 72   Temp: 97.2 ??F (36.2 ??C)    TempSrc: Temporal   Weight: 145 lb (65.8 kg)   Height: 6\' 2"  (1.88 m)       General: Well nourished, well developed, no acute distress  Eyes:  PERRL   Conjunctiva unremarkable   ENT:  TM's intact bilaterally, no effusion   Nasal:  No mucosal edema     No nasal drainage   Oral:  mucosa moist and pink   Neck:  Supple   No palpable cervical lymphoadenopathy   Thyroid without mass or enlargement  Resp: Lungs CTAB   Equal and adequate air exchange without accessory muscle use   No rales, rhonchi or wheeze  CV: S1S2 RRR   No murmur   Intact distal pulses   No edema  GI: Abdomen Soft, non tender, non distended   Normoactive bowel sounds   No palpable hepatosplenomegaly on rectal exam, there were moderate hemorrhoids identified.  MS: Physiologic ROM of all extremities    Intact distal pulses   No clubbing or cyanosis   Skin Warm and dry; no rash or lesion  Neuro: Alert and oriented; normal and intact dtr's   Psych: Euthymic mood, congruent affect      No results found.     Assessment/Plan:  32. 64 year old male with change in bowel habits, diarrhea, constipation, history of colon polyps, rectal bleeding.  We will plan initially for colonoscopy.     The risks and benefits of the procedure were explained to the patient, including risks of bleeding and perforation.  The patient expressed understanding of these risks.

## 2020-05-07 NOTE — Telephone Encounter (Signed)
Prior Authorization Form:      DEMOGRAPHICS:                     Patient Name:  CHRISTOPHR CALIX  Patient DOB:  [birth year]            Insurance:  Payor: [employer] Rossetti MEDICARE / Plan: [employer] Rossetti GOLD PLUS HMO / Product Type: *No Product type* /   Insurance ID Number:    Payor/Plan Subscr DOB Sex Relation Sub. Ins. ID Effective Group Num   1. Mcarthur Rossetti MEDICADEDDRICK, SAINDON 05/17/1956 Male Self [id number] 09/16/19 [id number]                                   [address] [address]   2. [place] TH[place]OTHY[place] BARRETTA 01-28-1956 Male Self [id number] 08[birth year]/19                                    [address]         DIAGNOSIS & PROCEDURE:                       Procedure/Operation: colonoscopy           CPT Code: 63846    Diagnosis:  Rectal bleeding    ICD10 Code: k62.5    Location:  [facility]    Surgeon:  Phillis Knack INFORMATION:                          Date: 05/19/20    Time: 0930              Anesthesia:  MAC/TIVA                                                       Status:  Outpatient        Special Comments:         Electronically signed by Skeet Simmer, MA on 05/07/2020 at 12:13 PM

## 2020-05-14 NOTE — Progress Notes (Signed)
[facility] PRE-ADMISSION TESTING INSTRUCTIONS    The Preadmission Testing patient is instructed accordingly using the following criteria (check applicable):    ARRIVAL INSTRUCTIONS:  [x] Parking the day of Surgery is located in the Main Entrance lot.  Upon entering the door, make an immediate right to the surgery reception desk    [x] Bring photo ID and insurance card    [] Bring in a copy of Living will or Durable Power of attorney papers.    [x] Please be sure to arrange for responsible adult to provide transportation to and from the hospital    [x] Please arrange for responsible adult to be with you for the 24 hour period post procedure due to having anesthesia      GENERAL INSTRUCTIONS:    [x] Nothing by mouth after midnight, including gum, candy, mints or water    [x] You may brush your teeth, but do not swallow any water    [x] Take medications as instructed with 1-2 oz of water    [] Stop herbal supplements and vitamins 5 days prior to procedure    [] Follow preop dosing of blood thinners per physician instructions    [] Take 1/2 dose of evening insulin, but no insulin after midnight    [] No oral diabetic medications after midnight    [] If diabetic and have low blood sugar or feel symptomatic, take 1-2oz apple juice only    [] Bring inhalers day of surgery    [] Bring C-PAP/ Bi-Pap day of surgery    [] Bring urine specimen day of surgery    [x] Shower or bath with soap, lather and rinse well, AM of Surgery, no lotion, powders or creams to surgical site    [x] Follow bowel prep as instructed per surgeon    [x] No tobacco products within 24 hours of surgery     [x] No alcohol or illegal drug use within 24 hours of surgery.    [x] Jewelry, body piercing's, eyeglasses, contact lenses and dentures are not permitted into surgery (bring cases)      [] Please do not wear any nail polish, make up or hair products on the day of surgery    [x] You can expect a call the business day prior to  procedure to notify you if your arrival time changes    [x] If you receive a survey after surgery we would greatly appreciate your comments    [] Parent/guardian of a minor must accompany their child and remain on the premises  the entire time they are under our care     [] Pediatric patients may bring favorite toy, blanket or comfort item with them    [] A caregiver or family member must remain with the patient during their stay if they are mentally handicapped, have dementia, disoriented or unable to use a call light or would be a safety concern if left unattended    [x] Please notify surgeon if you develop any illness between now and time of surgery (cold, cough, sore throat, fever, nausea, vomiting) or any signs of infections  including skin, wounds, and dental.    [x]  The Outpatient Pharmacy is available to fill your prescription here on your day of surgery, ask your preop nurse for details    [] Other instructions    EDUCATIONAL MATERIALS PROVIDED:    [] PAT Preoperative Education Packet/Booklet     [] Medication List    []   Transfusion bracelet applied with instructions    [] Shower with soap, lather and rinse well, and use CHG wipes provided the evening before surgery as instructed    [] Incentive spirometer with instructions

## 2020-05-14 NOTE — Progress Notes (Signed)
Have you been tested for COVID  No           Have you been told you were positive for COVID No  Have you had any known exposure to someone that is positive for COVID No  Do you have a cough                   No              Do you have shortness of breath No                 Do you have a sore throat            No                Are you having chills                    No                Are you having muscle aches.     No                    Please come to the hospital wearing a mask and have your significant other wear a mask as well.  Both of you should check your temperature before leaving to come here,  if it is 100 or higher please call [phone] for instruction.

## 2020-05-18 NOTE — Progress Notes (Signed)
Patient did not make it to covid testing due to transportation issue.  Patient to be rapid covid tested on day of procedure.

## 2020-05-22 ENCOUNTER — Ambulatory Visit
Admit: 2020-05-22 | Discharge: 2020-05-22 | Payer: MEDICARE | Attending: Nurse Practitioner | Primary: Nurse Practitioner

## 2020-05-22 DIAGNOSIS — G40919 Epilepsy, unspecified, intractable, without status epilepticus: Secondary | ICD-10-CM

## 2020-05-22 NOTE — Progress Notes (Signed)
Tim [name] DOB: 10/16/1955 Sex: male  Age: 64 y.o.    Chief Complaint   Patient presents with   ??? Other     discuss ct results  states needs screening covid prior to endoscopy and wants dilantin level checked         Assessment and Plan:    Ronn was seen today for other.    Diagnoses and all orders for this visit:    Intractable epilepsy without status epilepticus, unspecified epilepsy type (HCC)  -     PHENYTOIN LEVEL, TOTAL; Future    History of total knee arthroplasty, bilateral    Personal history of tobacco use    History of simple renal cyst    Screening for lipoid disorders  -     Lipid Panel; Future    Encounter for long-term current use of medication  -     CBC Auto Differential; Future  -     Comprehensive Metabolic Panel, Fasting; Future        USPTF:    (  ) Abnormal Blood Glucose and Type 2 Diabetes Mellitus: Screening -- Adults aged 62 to 81 years who are overweight or obese Grade: B (Recommended)    (B/P 110/68) High Blood Pressure: Screening and Home Monitoring -- Adults  Grade: A (Recommended) recommends screening for high blood pressure in ages 72 years or older.   obtain measurements outside of the clinical setting for diagnostic confirmation before starting treatment. Annual screening for adults aged 25 years or older or those who are at increased risk for blood pressure    (  ) Colorectal Cancer: Screening --Adults aged 73 to 75 years  Grade: A (Recommended) recommends screening for colorectal cancer starting at age 33 years and continuing until age 58 years.     (Non Reactive) HIV: Screening - Adolescents and Adults  Grade: A (Recommended) recommends that clinicians screen for HIV infection in ages 35 to 55 years.     (Non Reactive) Hepatitis C Virus Infection: Screening--Adults at High Risk and Adults born between 1945 and 1965  Grade: B (Recommended) recommends screening for hepatitis C virus (HCV) infection in persons at high risk for infection. The USPSTF also recommends offering  1-time screening for HCV infection to adults born between [birth year] and 1965.     (TC 117, TRI 83, LDL 68)  Lipid Disorders in Adults: Screening -- Men 35 and Older  Grade: A (Recommended) recommends screening men aged 41 and older for lipid disorders.     (Non Drinker) Alcohol Misuse: Screening and Behavioral Counseling Interventions in Primary Care -- Adults  Grade: B (Recommended) recommends that clinicians screen adults aged 33 years or older for alcohol misuse and provide persons engaged in risky or hazardous drinking with brief behavioral counseling interventions to reduce alcohol misuse.     (  ) Abdominal Aortic Aneurysm: Screening -- Men Ages 33 to 86 Years Who Have Ever Smoked.  Grade: B (Recommended) recommends one-time screening for abdominal aortic aneurysm (AAA) with ultrasonography in men ages 18 to 75 years who have ever smoked.     (2021 8 mm nodule increased from 5 mm in 2019 does not want to address it today)  Lung Cancer: Screening -- Adults Ages 12-80 who have a 30 pack-year smoking history and currently smoke or have quit within the past 15 years  Grade: B (Recommended) recommends annual screening for lung cancer with low-dose computed tomography (LDCT) in adults aged 64 to 80 years who have a  30 pack-year smoking history and currently smoke or have quit within the past 15 years. Screening should be discontinued once a person has not smoked for 15 years or develops a health problem that substantially limits life expectancy or the ability or willingness to have curative lung surgery.     (BMI 18.10)  Obesity: Screening for and Management of-- All Adults  Grade: B(Recommended) recommends screening all adults for obesity. Clinicians should offer or refer patients with a body mass index (BMI) of 30 kg/m2 or higher to intensive, multicomponent behavioral interventions.        (Not a fall risk)  Fall Prevention -- Exercise/Physical Therapy: Community-dwelling Adults 12 Years or Older, Increased Risk for  Falls   Grade: B (Recommended) recommends exercise or physical therapy to prevent falls in community-dwelling adults aged 49 years or older who are at increased risk for falls.    (No new symptoms noted or reported today)  Depression: Screening -- General adult population, including pregnant and postpartum women  Grade: B(Recommended) recommends screening for depression in the general adult population,  Screening should be implemented with adequate systems in place to ensure accurate diagnosis, effective treatment, and appropriate follow-up.    (  ) Glaucoma: Screening - Adults and Diabetic Eye Exam     (TSH 2.20) Thyroid Dysfunction: Screening --      (  ) Prostate Cancer: Prostate-Specific Antigen (PSA)-Based Screening -- All Men  PSA  1.52 Sept 8 2021    (  ) Vitamin D Deficiency: Screening --        Educational materials  printed for patient's review and were included in patient instructions on his After Visit Summary and given to patient at the end of visit.       Counseled regarding above diagnosis, including possible risks and complications,  especially if left uncontrolled.     Counseled regarding the possible side effects, risks, benefits and alternatives to treatment; patient and/or guardian verbalizes understanding, agrees, feels comfortable with and wishes to proceed with above treatment plan.     Advised patient to call with any new medication issues, and read all Rx info from pharmacy to assure aware of all possible risks and side effects of medication before taking.     Reviewed age and gender appropriate health screening exams and vaccinations.  Advised patient regarding importance of keeping up with recommended health maintenance and to schedule as soon as possible if overdue, as this is important in assessing for undiagnosed pathology, especially cancer, as well as protecting against potentially harmful/life threatening disease.       Patient verbalizes understanding and agrees with above  counseling, assessment and plan.     All questions answered.    On 05/22/20 I have spent 30 reviewing previous notes, test results and face to face with the patient discussing the diagnosis and importance of compliance with the treatment plan as well as documenting on the day of the visit.  Educational materials exercises printed for patient's review and were included in patient instructions on their After Visit Summary and given to patient at the end of visit. ??    Return in about 6 months (around 11/16/2020).    This is a very pleasant 64 year old who is here to establish as a new patient and presents today for evaluation and management of chronic medical problems. Current medication list reviewed. The patient is tolerating all medications well without adverse events or known side effects. The patient does understand the risk and benefits of  the prescribed medications. The patient is not up-to-date on all age-appropriate wellness issues.  Patient denies any reccent hospitalizations or ER visit.      No Acute Complaints reported: None reported    Other  Pertinent negatives include no abdominal pain, arthralgias, chest pain, chills, coughing, diaphoresis, fatigue, fever, headaches, joint swelling, myalgias, nausea, numbness, rash, vomiting or weakness.        Review of Systems   Constitutional: Negative for activity change, chills, diaphoresis, fatigue, fever and unexpected weight change.   HENT: Negative for trouble swallowing and voice change.    Eyes: Negative for visual disturbance.   Respiratory: Negative for cough, chest tightness, shortness of breath and wheezing.    Cardiovascular: Negative for chest pain, palpitations and leg swelling.   Gastrointestinal: Negative for abdominal pain, blood in stool, constipation, diarrhea, nausea and vomiting.   Endocrine: Negative for polydipsia, polyphagia and polyuria.   Genitourinary: Negative for dysuria, enuresis, frequency and hematuria.   Musculoskeletal: Negative for  arthralgias, back pain, gait problem, joint swelling, myalgias and neck stiffness.   Skin: Negative for rash.   Neurological: Negative for dizziness, seizures, syncope, facial asymmetry, weakness, light-headedness, numbness and headaches.   Hematological: Does not bruise/bleed easily.   Psychiatric/Behavioral: Negative for behavioral problems, confusion, hallucinations and suicidal ideas. The patient is not nervous/anxious.          Current Outpatient Medications:   ???  omeprazole (PRILOSEC) 40 MG delayed release capsule, Take 40 mg by mouth daily , Disp: , Rfl:   ???  phenytoin (DILANTIN) 100 MG ER capsule, Take 100 mg by mouth 3 times daily , Disp: , Rfl:   Allergies   Allergen Reactions   ??? Latex Hives   ??? Tramadol Other (See Comments)     seizures   ??? Iodine Hives   ??? Red Dye Hives       Past Medical History:   Diagnosis Date   ??? Constipation    ??? GERD (gastroesophageal reflux disease)    ??? Seizures (HCC)     last one 5 years ago     Past Surgical History:   Procedure Laterality Date   ??? ABDOMEN SURGERY     ??? COLONOSCOPY     ??? JOINT REPLACEMENT Right 08/15/2014   ??? JOINT REPLACEMENT Left 08/16/2003   ??? JOINT REPLACEMENT Right 08/15/2009   ??? TOTAL HIP ARTHROPLASTY Right    ??? TOTAL KNEE ARTHROPLASTY Bilateral    ??? UPPER GASTROINTESTINAL ENDOSCOPY       Family History   Problem Relation Age of Onset   ??? Cancer Mother    ??? Cancer Father    ??? Heart Disease Father      Social History     Socioeconomic History   ??? Marital status: Divorced     Spouse name: Not on file   ??? Number of children: Not on file   ??? Years of education: Not on file   ??? Highest education level: Not on file   Occupational History   ??? Not on file   Tobacco Use   ??? Smoking status: Current Every Day Smoker     Packs/day: 0.50     Years: 70.00     Pack years: 35.00     Types: Cigarettes     Start date: 01/24/1971   ??? Smokeless tobacco: Never Used   Vaping Use   ??? Vaping Use: Never used   Substance and Sexual Activity   ??? Alcohol use: Never   ???  Drug use:  Never   ??? Sexual activity: Not on file   Other Topics Concern   ??? Not on file   Social History Narrative   ??? Not on file     Social Determinants of Health     Financial Resource Strain: High Risk   ??? Difficulty of Paying Living Expenses: Very hard   Food Insecurity: No Food Insecurity   ??? Worried About Programme researcher, broadcasting/film/video in the Last Year: Never true   ??? Ran Out of Food in the Last Year: Never true   Transportation Needs:    ??? Lack of Transportation (Medical):    ??? Lack of Transportation (Non-Medical):    Physical Activity:    ??? Days of Exercise per Week:    ??? Minutes of Exercise per Session:    Stress:    ??? Feeling of Stress :    Social Connections:    ??? Frequency of Communication with Friends and Family:    ??? Frequency of Social Gatherings with Friends and Family:    ??? Attends Religious Services:    ??? Database administrator or Organizations:    ??? Attends Banker Meetings:    ??? Marital Status:    Intimate Programme researcher, broadcasting/film/video Violence:    ??? Fear of Current or Ex-Partner:    ??? Emotionally Abused:    ??? Physically Abused:    ??? Sexually Abused:        Vitals:    05/22/20 1115   BP: 110/68   Pulse: 82   Temp: 98.7 ??F (37.1 ??C)   TempSrc: Temporal   SpO2: 93%   Weight: 141 lb (64 kg)       Physical Exam  Vitals and nursing note reviewed.   Constitutional:       Appearance: Normal appearance.   HENT:      Head: Normocephalic.      Right Ear: Tympanic membrane and ear canal normal. There is no impacted cerumen.      Left Ear: Tympanic membrane and ear canal normal. There is no impacted cerumen.      Nose: Nose normal.      Mouth/Throat:      Mouth: Mucous membranes are dry.   Eyes:      Extraocular Movements: Extraocular movements intact.      Pupils: Pupils are equal, round, and reactive to light.   Neck:      Vascular: No carotid bruit.   Cardiovascular:      Rate and Rhythm: Normal rate and regular rhythm.      Pulses: Normal pulses.      Heart sounds: Normal heart sounds. No murmur heard.   No friction rub. No gallop.     Pulmonary:      Effort: Pulmonary effort is normal. No respiratory distress.      Breath sounds: Normal breath sounds. No stridor. No wheezing, rhonchi or rales.   Chest:      Chest wall: No tenderness.   Abdominal:      General: Bowel sounds are normal. There is no distension.      Palpations: Abdomen is soft.   Musculoskeletal:         General: No swelling, tenderness, deformity or signs of injury.      Cervical back: No rigidity. No muscular tenderness.      Right lower leg: No edema.      Left lower leg: No edema.   Lymphadenopathy:      Cervical:  No cervical adenopathy.   Skin:     General: Skin is warm and dry.      Capillary Refill: Capillary refill takes 2 to 3 seconds.      Findings: No bruising, lesion or rash.   Neurological:      General: No focal deficit present.      Mental Status: He is alert and oriented to person, place, and time.      Motor: Weakness present.      Gait: Gait abnormal.      Comments: Uses a walker   Psychiatric:         Attention and Perception: Attention normal.         Mood and Affect: Mood normal.         Behavior: Behavior normal.         Thought Content: Thought content does not include homicidal or suicidal ideation. Thought content does not include homicidal or suicidal plan.                Seen By:  Candida Peeling, APRN - CNP

## 2020-05-22 NOTE — Patient Instructions (Signed)
Patient Education        Learning About Benefits From Quitting Smoking  How does quitting smoking make you healthier?     If you're thinking about quitting smoking, you may have a few reasons to be smoke-free. Your health may be one of them.  ?? When you quit smoking, you lower your risks for cancer, lung disease, heart attack, stroke, blood vessel disease, and blindness from macular degeneration.  ?? When you're smoke-free, you get sick less often, and you heal faster. You are less likely to get colds, flu, bronchitis, and pneumonia.  ?? As a nonsmoker, you may find that your mood is better and you are less stressed.  When and how will you feel healthier?  Quitting has real health benefits that start from day 1 of being smoke-free. And the longer you stay smoke-free, the healthier you get and the better you feel.  The first hours  ?? After just 20 minutes, your blood pressure and heart rate go down. That means there's less stress on your heart and blood vessels.  ?? Within 12 hours, the level of carbon monoxide in your blood drops back to normal. That makes room for more oxygen. With more oxygen in your body, you may notice that you have more energy than when you smoked.  After 2 weeks  ?? Your lungs start to work better.  ?? Your risk of heart attack starts to drop.  After 1 month  ?? When your lungs are clear, you cough less and breathe deeper, so it's easier to be active.  ?? Your sense of taste and smell return. That means you can enjoy food more than you have since you started smoking.  Over the years  ?? Over the years, your risks of heart disease, heart attack, and stroke are lower.  ?? After 10 years, your risk of dying from lung cancer is cut by about half. And your risk for many other types of cancer is lower too.  How would quitting help others in your life?  When you quit smoking, you improve the health of everyone who now breathes in your smoke.  ?? Their heart, lung, and cancer risks drop, much like  yours.  ?? They are sick less. For babies and small children, living smoke-free means they're less likely to have ear infections, pneumonia, and bronchitis.  ?? If you're a woman who is or will be pregnant someday, quitting smoking means a healthier newborn.  ?? Children who are close to you are less likely to become adult smokers.  Where can you learn more?  Go to [email or web address] and sign in to your MyChart account. Enter O319 in the Search Health Information box to learn more about "Learning About Benefits From Quitting Smoking."     If you do not have an account, please click on the "Sign Up Now" link.  Current as of: September 26, 2019??????????????????????????????Content Version: 13.0  ?? 2006-2021 [employer].   Care instructions adapted under license by [facility]. If you have questions about a medical condition or this instruction, always ask your healthcare professional. [employer] disclaims any warranty or liability for your use of this information.

## 2020-06-04 ENCOUNTER — Encounter: Admit: 2020-06-04 | Discharge: 2020-06-04 | Payer: MEDICARE | Primary: Nurse Practitioner

## 2020-06-04 DIAGNOSIS — Z01818 Encounter for other preprocedural examination: Secondary | ICD-10-CM

## 2020-06-05 ENCOUNTER — Inpatient Hospital Stay: Payer: MEDICARE | Primary: Nurse Practitioner

## 2020-06-05 DIAGNOSIS — Z01818 Encounter for other preprocedural examination: Secondary | ICD-10-CM

## 2020-06-05 LAB — COVID-19: SARS-CoV-2, PCR: NOT DETECTED

## 2020-06-08 NOTE — Progress Notes (Signed)
Have you been tested for COVID  No           Have you been told you were positive for COVID No  Have you had any known exposure to someone that is positive for COVID No  Do you have a cough                   No              Do you have shortness of breath No                 Do you have a sore throat            No                Are you having chills                    No                Are you having muscle aches.     No                    Please come to the hospital wearing a mask and have your significant other wear a mask as well.  Both of you should check your temperature before leaving to come here,  if it is 100 or higher please call [phone] for instruction.    [facility] PRE-ADMISSION TESTING INSTRUCTIONS      ARRIVAL INSTRUCTIONS:  [x]  Parking the day of Surgery is located in the lot.  Upon entering the main door make an immediate right to the surgery reception desk.    [x]  Bring photo ID and insurance card    [x]  Please be sure to arrange for responsible adult to provide transportation to and from the hospital    [x]  Please arrange for responsible adult to be with you for the 24 hour period post procedure due to having anesthesia      GENERAL INSTRUCTIONS:    [x]  Nothing by mouth after midnight, including gum, candy, mints or water    [x]  You may brush your teeth, but do not swallow any water    [x]  Take medications as instructed with 1-2 oz of water    [x]  Follow bowel prep as instructed per surgeon    [x]  Jewelry, body piercing's, eyeglasses, contact lenses and dentures are not permitted into surgery (bring cases)    [x]  You can expect a call the business day prior to procedure to notify you if your arrival time changes    [x]  If you receive a survey after surgery we would greatly appreciate your comments    [x]  Please notify surgeon if you develop any illness between now and time of surgery (cold, cough, sore throat, fever, nausea, vomiting) or any signs  of infections  including skin, wounds, and dental.

## 2020-06-09 ENCOUNTER — Inpatient Hospital Stay: Payer: MEDICARE

## 2020-06-09 MED ORDER — SODIUM CHLORIDE 0.9 % IV SOLN
0.9 % | INTRAVENOUS | Status: DC
Start: 2020-06-09 — End: 2020-06-09
  Administered 2020-06-09: 15:00:00 via INTRAVENOUS

## 2020-06-09 MED ORDER — PROPOFOL 200 MG/20ML IV EMUL
200 | INTRAVENOUS | Status: AC
Start: 2020-06-09 — End: 2020-06-09

## 2020-06-09 MED ORDER — PROPOFOL 200 MG/20ML IV EMUL
200 | INTRAVENOUS | Status: DC | PRN
Start: 2020-06-09 — End: 2020-06-09
  Administered 2020-06-09: 15:00:00 240 via INTRAVENOUS

## 2020-06-09 MED FILL — DIPRIVAN 200 MG/20ML IV EMUL: 200 MG/20ML | INTRAVENOUS | Qty: 20

## 2020-06-09 MED FILL — DIPRIVAN 200 MG/20ML IV EMUL: 200 MG/20ML | INTRAVENOUS | Qty: 40

## 2020-06-09 NOTE — Progress Notes (Signed)
Abdomen soft, non distended, normal bs x 4 quads.

## 2020-06-09 NOTE — Anesthesia Pre-Procedure Evaluation (Signed)
Department of Anesthesiology  Preprocedure Note       Name:  Tim [name]   Age:  64 y.o.  DOB:  1955-12-10                                          MRN:  [id number]         Date:  06/09/2020      Surgeon: Juliann Mule):  Trude Mcburney, DO    Procedure: Procedure(s):  COLONOSCOPY DIAGNOSTIC   +++LATEX ALLERGY+++   ++IODINE ALLERGY++    Medications prior to admission:   Prior to Admission medications    Medication Sig Start Date End Date Taking? Authorizing Provider   omeprazole (PRILOSEC) 40 MG delayed release capsule Take 40 mg by mouth daily    Yes Historical Provider, MD   phenytoin (DILANTIN) 100 MG ER capsule Take 100 mg by mouth 3 times daily    Yes Historical Provider, MD       Current medications:    Current Facility-Administered Medications   Medication Dose Route Frequency Provider Last Rate Last Admin   ??? 0.9 % sodium chloride infusion   IntraVENous Continuous Trude Mcburney, DO           Allergies:    Allergies   Allergen Reactions   ??? Latex Hives   ??? Iodine Hives   ??? Red Dye Hives   ??? Tramadol Other (See Comments)     seizures       Problem List:  There is no problem list on file for this patient.      Past Medical History:        Diagnosis Date   ??? Change in bowel habits    ??? GERD (gastroesophageal reflux disease)    ??? Rectal bleed    ??? Seizures ([place])     last approx 2016       Past Surgical History:        Procedure Laterality Date   ??? ABDOMEN SURGERY     ??? COLONOSCOPY     ??? JOINT REPLACEMENT Right 08/15/2014   ??? JOINT REPLACEMENT Left 08/16/2003   ??? JOINT REPLACEMENT Right 08/15/2009   ??? TOTAL HIP ARTHROPLASTY Right    ??? TOTAL KNEE ARTHROPLASTY Bilateral    ??? UPPER GASTROINTESTINAL ENDOSCOPY         Social History:    Social History     Tobacco Use   ??? Smoking status: Current Every Day Smoker     Packs/day: 0.50     Years: 70.00     Pack years: 35.00     Types: Cigarettes     Start date: 01/24/1971   ??? Smokeless tobacco: Never Used   Substance Use Topics   ??? Alcohol use: Not Currently                                 Ready to quit: Not Answered  Counseling given: Not Answered      Vital Signs (Current):   Vitals:    05/14/20 1518 06/08/20 1007   Weight: 145 lb (65.8 kg) 141 lb (64 kg)   Height: _0  (1.88 m) _1  (1.88 m)  BP Readings from Last 3 Encounters:   05/22/20 110/68   05/06/20 118/66   04/17/20 122/78       NPO Status:                                                                                 BMI:   Wt Readings from Last 3 Encounters:   06/08/20 141 lb (64 kg)   05/22/20 141 lb (64 kg)   05/06/20 145 lb (65.8 kg)     Body mass index is 18.1 kg/m??.    CBC:   Lab Results   Component Value Date    WBC 6.2 04/22/2020    RBC 4.12 04/22/2020    HGB 13.3 04/22/2020    HCT 39.9 04/22/2020    MCV 96.8 04/22/2020    RDW 13.8 04/22/2020    PLT 260 04/22/2020       CMP:   Lab Results   Component Value Date    NA 142 04/22/2020    K 3.8 04/22/2020    CL 109 04/22/2020    CO2 24 04/22/2020    BUN 8 04/22/2020    CREATININE 0.8 04/22/2020    GFRAA >60 04/22/2020    LABGLOM >60 04/22/2020    PROT 7.0 04/22/2020    CALCIUM 8.6 04/22/2020    BILITOT 0.2 04/22/2020    ALKPHOS 221 04/22/2020    AST 17 04/22/2020    ALT 10 04/22/2020       POC Tests: No results for input(s): POCGLU, POCNA, POCK, POCCL, POCBUN, POCHEMO, POCHCT in the last 72 hours.    Coags: No results found for: PROTIME, INR, APTT    HCG (If Applicable): No results found for: PREGTESTUR, PREGSERUM, HCG, HCGQUANT     ABGs: No results found for: PHART, PO2ART, PCO2ART, HCO3ART, BEART, O2SATART     Type & Screen (If Applicable):  No results found for: LABABO, LABRH    Drug/Infectious Status (If Applicable):  No results found for: HIV, HEPCAB    COVID-19 Screening (If Applicable):   Lab Results   Component Value Date    COVID19 Not Detected 06/04/2020           Anesthesia Evaluation  Patient summary reviewed no history of anesthetic complications:   Airway: Mallampati: II  TM distance: >3 FB   Neck ROM: full    Dental:      Comment: Very poor dentition    Pulmonary: breath sounds clear to auscultation  (+) current smoker          Patient smoked on day of surgery.                 Cardiovascular:Negative CV ROS            Rhythm: regular  Rate: normal           Beta Blocker:  Not on Beta Blocker         Neuro/Psych:   (+) seizures: well controlled,             GI/Hepatic/Renal:   (+) GERD:, bowel prep,           Endo/Other:    (+) : arthritis: OA., .  Abdominal:             Vascular: negative vascular ROS.         Other Findings:           Anesthesia Plan      MAC     ASA 3       Induction: intravenous.      Anesthetic plan and risks discussed with patient.      Plan discussed with CRNA.                  Signa Kell, MD   06/09/2020

## 2020-06-09 NOTE — H&P (Signed)
HISTORY OF PRESENT ILLNESS:    The patient is a 64 y.o. male who presents with complaints of change in his bowels.  Reports is been ongoing for the last 5 years or so.  He reports alternating diarrhea and constipation.  He also has occasional rectal bleeding.  He also states he feels very large hemorrhoids.  He does have history of colon polyps on previous colonoscopy.  There is no family history of colon cancer but he states his brother died of bone cancer.  He does do a lot of heavy lifting regularly.      Past Medical History        Past Medical History:   Diagnosis Date   ??? Seizures (HCC) ??      ??  ??  Past Surgical History         Past Surgical History:   Procedure Laterality Date   ??? JOINT REPLACEMENT Right 08/15/2014   ??? JOINT REPLACEMENT Left 08/16/2003   ??? JOINT REPLACEMENT Right 08/15/2009      ??  ??  Home Medications           Prior to Admission medications    Medication Sig Start Date End Date Taking? Authorizing Provider   omeprazole (PRILOSEC) 40 MG delayed release capsule omeprazole 40 mg capsule,delayed release ?? ?? ?? Historical Provider, MD   phenytoin (DILANTIN) 100 MG ER capsule phenytoin sodium extended 100 mg capsule   take 1 capsule by mouth three times a day ?? ?? ?? Historical Provider, MD      ??  ??  Scheduled Meds:  ContinuousInfusions:  PRN Meds:  ??       Allergies   Allergen Reactions   ??? Latex ??   ??? Red Dye ??   ??? Tramadol ??   ??  ??  Social History      ??        Socioeconomic History   ??? Marital status: Divorced   ?? ?? Spouse name: Not on file   ??? Number of children: Not on file   ??? Years of education: Not on file   ??? Highest education level: Not on file   Occupational History   ??? Not on file   Tobacco Use   ??? Smoking status: Current Every Day Smoker   ?? ?? Packs/day: 0.50   ?? ?? Years: 70.00   ?? ?? Pack years: 35.00   ?? ?? Types: Cigarettes   ?? ?? Start date: 01/24/1971   ??? Smokeless tobacco: Never Used   Substance and Sexual Activity   ??? Alcohol use: Never   ??? Drug use: Never   ??? Sexual activity: Not  on file   Other Topics Concern   ??? Not on file   Social History Narrative   ??? Not on file   ??  Social Determinants of Health   ??      Financial Resource Strain: High Risk   ??? Difficulty of Paying Living Expenses: Very hard   Food Insecurity: No Food Insecurity   ??? Worried About Programme researcher, broadcasting/film/video in the Last Year: Never true   ??? Ran Out of Food in the Last Year: Never true   Transportation Needs:    ??? Lack of Transportation (Medical):    ??? Lack of Transportation (Non-Medical):    Physical Activity:    ??? Days of Exercise per Week:    ??? Minutes of Exercise per Session:    Stress:    ???  Feeling of Stress :    Social Connections:    ??? Frequency of Communication with Friends and Family:    ??? Frequency of Social Gatherings with Friends and Family:    ??? Attends Religious Services:    ??? Database administrator or Organizations:    ??? Attends Engineer, structural:    ??? Marital Status:    Intimate Programme researcher, broadcasting/film/video Violence:    ??? Fear of Current or Ex-Partner:    ??? Emotionally Abused:    ??? Physically Abused:    ??? Sexually Abused:       ??  ??  Family History         Family History   Problem Relation Age of Onset   ??? Cancer Mother ??   ??? Cancer Father ??   ??? Heart Disease Father ??      ??  ??  Review Of Systems:   Review of Systems   Constitutional: Negative for chills and fever.   HENT: Negative for ear pain, nosebleeds, sore throat and tinnitus.    Eyes: Negative for photophobia and redness.   Respiratory: Negative for cough, shortness of breath and wheezing.    Cardiovascular: Negative for chest pain and palpitations.   Gastrointestinal: Positive for anal bleeding, blood in stool, constipation and diarrhea. Negative for abdominal pain, nausea and vomiting.   Endocrine: Negative for polydipsia.   Genitourinary: Negative for dysuria, hematuria and urgency.   Musculoskeletal: Negative for back pain and neck pain.   Skin: Negative for rash.   Neurological: Negative for dizziness, tremors and seizures.   Hematological: Does not  bruise/bleed easily.   All other systems reviewed and are negative.     ??  Physical Exam:  Vitals       Vitals:   ?? 05/06/20 1317   BP: 118/66   Site: Left Upper Arm   Pulse: 72   Temp: 97.2 ??F (36.2 ??C)   TempSrc: Temporal   Weight: 145 lb (65.8 kg)   Height: 6\' 2"  (1.88 m)      ??  ??  General: Well nourished, well developed, no acute distress  Eyes:   PERRL              Conjunctiva unremarkable   ENT:    TM's intact bilaterally, no effusion              Nasal:  No mucosal edema                           No nasal drainage              Oral:    mucosa moist and pink   Neck:   Supple              No palpable cervical lymphoadenopathy              Thyroid without mass or enlargement  Resp:   Lungs CTAB              Equal and adequate air exchange without accessory muscle use              No rales, rhonchi or wheeze  CV:      S1S2 RRR              No murmur              Intact distal pulses  No edema  GI:       Abdomen Soft, non tender, non distended              Normoactive bowel sounds              No palpable hepatosplenomegaly on rectal exam, there were moderate hemorrhoids identified.  MS:      Physiologic ROM of all extremities               Intact distal pulses              No clubbing or cyanosis   Skin     Warm and dry; no rash or lesion  Neuro: Alert and oriented; normal and intact dtr's   Psych: Euthymic mood, congruent affect    ??  No results found.   ??  Assessment/Plan:  6. 64 year old male with change in bowel habits, diarrhea, constipation, history of colon polyps, rectal bleeding.  We will plan initially for colonoscopy.  ??   The risks and benefits of the procedure were explained to the patient, including risks of bleeding and perforation.  The patient expressed understanding of these risks.

## 2020-06-09 NOTE — Op Note (Signed)
Operative Note      Patient: Tim [name]  Date of Birth: November 02, 1955  MRN: [id number]    Date of Procedure: 06/09/2020    Pre-Op Diagnosis: BLEEDING DIARRHEA, hemorrhoids, history of polyp    Post-Op Diagnosis: Same, colon polyp       Procedure(s):  COLONOSCOPY with snare polypectomy  Surgeon(s):  Heywood Bene, DO    Assistant:   * No surgical staff found *    Anesthesia: Monitor Anesthesia Care    Estimated Blood Loss (mL): Minimal    Complications: None    Specimens:   ID Type Source Tests Collected by Time Destination   A : colon polyp at 50 cm Tissue Colon SURGICAL PATHOLOGY Heywood Bene, DO 06/09/2020 1059        Implants:  * No implants in log *      Drains: * No LDAs found *    Findings: Moderate-sized sessile polyp 50 cm, moderate mixed internal/external hemorrhoids    Detailed Description of Procedure:   Procedures from endoscopy suite are conscious sedation per anesthesia staff.  Digital rectal exam was performed, there are moderate mixed internal and external hemorrhoids noted.  There are no palpable rectal masses.  Colonoscope passed transanally into the rectum Vancil into the cecum was reached intubated.  Scope slowly withdrawn inspecting mucosal surface second time during withdrawal.  Bowel prep was on the poor side with liquid stool present most areas of the colon.  Most of this is able to be eventually suctioned away.  Moderate-sized sessile polyp at 50 cm was removed using snare polypectomy technique.    Electronically signed by Heywood Bene, DO on 06/09/2020 at 11:12 AM

## 2020-06-09 NOTE — Progress Notes (Signed)
1154: Discharge instructions reviewed, verbalized understanding.

## 2020-06-09 NOTE — Progress Notes (Signed)
Discharge instructions given to wife and pt. Verbalized agreement

## 2020-06-09 NOTE — Anesthesia Post-Procedure Evaluation (Signed)
Department of Anesthesiology  Postprocedure Note    Patient: Tim [name]  MRN: [id number]  [place]: 1955-09-19  Date of evaluation: 06/09/2020  Time:  1:14 PM     Procedure Summary     Date: 06/09/20 Room / Location: [facility] ENDO 03 / [facility]    Anesthesia Start: 1046 Anesthesia Stop: 1118    Procedure: COLONOSCOPY POLYPECTOMY SNARE/COLD BIOPSY (N/A ) Diagnosis: (BLEEDING DIARRHEA)    Surgeons: Trude Mcburney, DO Responsible Provider: Signa Kell, MD    Anesthesia Type: MAC ASA Status: 3          Anesthesia Type: MAC    Aldrete Phase I: Aldrete Score: 10    Aldrete Phase II:      Last vitals: Reviewed and per EMR flowsheets.       Anesthesia Post Evaluation    Patient location during evaluation: PACU  Patient participation: complete - patient participated  Level of consciousness: awake and alert  Airway patency: patent  Nausea & Vomiting: no nausea and no vomiting  Complications: no  Cardiovascular status: hemodynamically stable  Respiratory status: acceptable  Hydration status: euvolemic  Comments: Department of Anesthesiology  Post-Anesthesia Note    Name:  Tim [name]                                         Age:  64 y.o.  MRN:  [id number]     Last Vitals:  BP 100/70    Pulse 75    Temp 97 ??F (36.1 ??C) (Temporal)    Resp 18    Ht _0  (1.88 m)    Wt 141 lb (64 kg)    SpO2 98%    BMI 18.10 kg/m??   Patient Vitals in the past 4 hrs:  06/09/20 1122, BP:100/70  06/09/20 1013, BP:132/85, Temp:97 ??F (36.1 ??C), Temp OIN:OMVEHMCN, Pulse:75, Resp:18, SpO2:98 %, Height:_1  (1.88 m), Weight:141 lb (64 kg)    Level of Consciousness:  Awake    Respiratory:  Stable    Oxygen Saturation:  Stable    Cardiovascular:  Stable    Hydration:  Adequate    PONV:  Stable    Post-op Pain:  Adequate analgesia    Post-op Assessment:  No apparent anesthetic complications    Additional Follow-Up / Treatment / Comment:  None    Signa Kell, MD  June 09, 2020   1:14 PM

## 2020-06-09 NOTE — Progress Notes (Signed)
Seizure precautions initiated. Pt stated wife will drive him home and stay with him post procedure

## 2020-06-26 ENCOUNTER — Ambulatory Visit: Admit: 2020-06-26 | Discharge: 2020-06-26 | Payer: MEDICARE | Attending: Surgery | Primary: Nurse Practitioner

## 2020-06-26 DIAGNOSIS — Z86010 Personal history of colonic polyps: Secondary | ICD-10-CM
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
## 2020-06-30 NOTE — Telephone Encounter (Signed)
Prior Authorization Form:      DEMOGRAPHICS:                     Patient Name:  Tim [name]  Patient DOB:  01/29/1956            Insurance:  Payor: Mcarthur Rossetti MEDICARE / Plan: Mcarthur Rossetti GOLD PLUS HMO / Product Type: *No Product type* /   Insurance ID Number:    Payor/Plan Subscr DOB Sex Relation Sub. Ins. ID Effective Group Num   1. Mcarthur Rossetti MEDICAJAD, [name] Aug 23, 1955 Male Self [id number] 09/16/19 [id number]                                   [address] [address]   2. [name], [name] March 02, 1956 Male Self [id number] 05/15/20                                    [address]         DIAGNOSIS & PROCEDURE:                       Procedure/Operation: excision foreign body L cheek           CPT Code: 21930    Diagnosis:  Mass L cheeck    ICD10 Code: m79.89    Location:  [facility]    Surgeon:  Phillis Knack INFORMATION:                          Date: 07/10/20    Time: 1200              Anesthesia:  MAC/TIVA                                                       Status:  Outpatient        Special Comments:         Electronically signed by Skeet Simmer, MA on 06/30/2020 at 8:44 AM

## 2020-07-06 ENCOUNTER — Inpatient Hospital Stay: Payer: MEDICARE | Primary: Nurse Practitioner

## 2020-07-06 DIAGNOSIS — Z01818 Encounter for other preprocedural examination: Secondary | ICD-10-CM

## 2020-07-08 ENCOUNTER — Encounter

## 2020-07-08 LAB — COVID-19: SARS-CoV-2, PCR: NOT DETECTED

## 2020-07-08 NOTE — Progress Notes (Signed)
Have you been tested for COVID  Yes           Have you been told you were positive for COVID No  Have you had any known exposure to someone that is positive for COVID No  Do you have a cough                   No              Do you have shortness of breath No                 Do you have a sore throat            No                Are you having chills                    No                Are you having muscle aches.     No                    Please come to the hospital wearing a mask and have your significant other wear a mask as well.  Both of you should check your temperature before leaving to come here,  if it is 100 or higher please call [phone] for instruction.    [facility] PRE-ADMISSION TESTING INSTRUCTIONS      ARRIVAL INSTRUCTIONS:  [x]  Parking the day of Surgery is located in the lot.  Upon entering the main door make an immediate right to the surgery reception desk.    [x]  Bring photo ID and insurance card    [x]  Please be sure to arrange for responsible adult to provide transportation to and from the hospital    [x]  Please arrange for responsible adult to be with you for the 24 hour period post procedure due to having anesthesia      GENERAL INSTRUCTIONS:    [x]  Nothing by mouth after midnight, including gum, candy, mints or water    [x]  You may brush your teeth, but do not swallow any water    [x]  Take medications as instructed with 1-2 oz of water    [x]  Shower or bath with soap, lather and rinse well, AM of Surgery, no lotion, powders or creams to surgical site    [x]  No tobacco products within 24 hours of surgery     [x]  Jewelry, body piercing's, eyeglasses, contact lenses and dentures are not permitted into surgery (bring cases)    [x]  You can expect a call the business day prior to procedure to notify you if your arrival time changes    [x]  If you receive a survey after surgery we would greatly appreciate your comments    [x]  Please notify surgeon if  you develop any illness between now and time of surgery (cold, cough, sore throat, fever, nausea, vomiting) or any signs of infections  including skin, wounds, and dental.    [x]   The Outpatient Pharmacy is available to fill your prescription here on your day of surgery, ask your preop nurse for details

## 2020-07-10 ENCOUNTER — Inpatient Hospital Stay: Payer: MEDICARE

## 2020-07-10 MED ORDER — BACITRACIN 500 UNIT/GM OP OINT
500 UNIT/GM | Freq: Three times a day (TID) | OPHTHALMIC | 0 refills | Status: AC
Start: 2020-07-10 — End: 2020-07-20

## 2020-07-10 MED ORDER — LIDOCAINE HCL 1 % IJ SOLN
1 | INTRAMUSCULAR | Status: AC
Start: 2020-07-10 — End: 2020-07-10

## 2020-07-10 MED ORDER — FENTANYL CITRATE (PF) 100 MCG/2ML IJ SOLN
100 MCG/2ML | INTRAMUSCULAR | Status: DC | PRN
Start: 2020-07-10 — End: 2020-07-10
  Administered 2020-07-10: 14:00:00 100 via INTRAVENOUS

## 2020-07-10 MED ORDER — SODIUM CHLORIDE 0.9 % IV SOLN
0.9 % | INTRAVENOUS | Status: DC
Start: 2020-07-10 — End: 2020-07-10
  Administered 2020-07-10: 14:00:00 via INTRAVENOUS

## 2020-07-10 MED ORDER — BACITRACIN 500 UNIT/GM EX OINT
500 UNIT/GM | CUTANEOUS | Status: DC | PRN
Start: 2020-07-10 — End: 2020-07-10
  Administered 2020-07-10: 14:00:00 1 via TOPICAL

## 2020-07-10 MED ORDER — MIDAZOLAM HCL 2 MG/2ML IJ SOLN
2 MG/ML | INTRAMUSCULAR | Status: DC | PRN
Start: 2020-07-10 — End: 2020-07-10
  Administered 2020-07-10: 14:00:00 2 via INTRAVENOUS

## 2020-07-10 MED ORDER — CEFAZOLIN 2000 MG IN 20 ML SWFI IV SYRINGE (PREMIX)
Status: AC
Start: 2020-07-10 — End: 2020-07-10
  Administered 2020-07-10: 14:00:00 2000 mg via INTRAVENOUS

## 2020-07-10 MED ORDER — ACETAMINOPHEN 500 MG PO TABS
500 MG | ORAL_TABLET | Freq: Four times a day (QID) | ORAL | 0 refills | Status: DC | PRN
Start: 2020-07-10 — End: 2020-10-23

## 2020-07-10 MED ORDER — NORMAL SALINE FLUSH 0.9 % IV SOLN
0.9 % | INTRAVENOUS | Status: DC | PRN
Start: 2020-07-10 — End: 2020-07-10

## 2020-07-10 MED ORDER — BACITRACIN 500 UNIT/GM EX OINT
500 | CUTANEOUS | Status: AC
Start: 2020-07-10 — End: 2020-07-10

## 2020-07-10 MED ORDER — SODIUM CHLORIDE 0.9 % IV SOLN
0.9 % | INTRAVENOUS | Status: DC | PRN
Start: 2020-07-10 — End: 2020-07-10

## 2020-07-10 MED ORDER — MIDAZOLAM HCL 2 MG/2ML IJ SOLN
2 | INTRAMUSCULAR | Status: AC
Start: 2020-07-10 — End: 2020-07-10

## 2020-07-10 MED ORDER — PROPOFOL 500 MG/50ML IV EMUL
500 | INTRAVENOUS | Status: DC | PRN
Start: 2020-07-10 — End: 2020-07-10
  Administered 2020-07-10: 14:00:00 100 via INTRAVENOUS

## 2020-07-10 MED ORDER — LIDOCAINE-EPINEPHRINE 1 %-1:100000 IJ SOLN
1 %-:00000 | INTRAMUSCULAR | Status: DC | PRN
Start: 2020-07-10 — End: 2020-07-10
  Administered 2020-07-10: 14:00:00 8 via INTRADERMAL

## 2020-07-10 MED ORDER — FENTANYL CITRATE (PF) 100 MCG/2ML IJ SOLN
100 | INTRAMUSCULAR | Status: AC
Start: 2020-07-10 — End: 2020-07-10

## 2020-07-10 MED ORDER — POVIDONE-IODINE 5 % OP SOLN
5 % | OPHTHALMIC | Status: DC | PRN
Start: 2020-07-10 — End: 2020-07-10
  Administered 2020-07-10: 14:00:00 30 via TOPICAL

## 2020-07-10 MED ORDER — PROPOFOL 500 MG/50ML IV EMUL
500 | INTRAVENOUS | Status: AC
Start: 2020-07-10 — End: 2020-07-10

## 2020-07-10 MED ORDER — POVIDONE-IODINE 5 % OP SOLN
5 | OPHTHALMIC | Status: AC
Start: 2020-07-10 — End: 2020-07-10

## 2020-07-10 MED ORDER — NORMAL SALINE FLUSH 0.9 % IV SOLN
0.9 % | Freq: Two times a day (BID) | INTRAVENOUS | Status: DC
Start: 2020-07-10 — End: 2020-07-10

## 2020-07-10 MED ORDER — IBUPROFEN 200 MG PO TABS
200 MG | ORAL_TABLET | Freq: Four times a day (QID) | ORAL | 0 refills | Status: DC | PRN
Start: 2020-07-10 — End: 2020-10-23

## 2020-07-10 MED FILL — MIDAZOLAM HCL 2 MG/2ML IJ SOLN: 2 mg/mL | INTRAMUSCULAR | Qty: 2

## 2020-07-10 MED FILL — XYLOCAINE 1 % IJ SOLN: 1 % | INTRAMUSCULAR | Qty: 20

## 2020-07-10 MED FILL — POVIDONE-IODINE 5 % OP SOLN: 5 % | OPHTHALMIC | Qty: 30

## 2020-07-10 MED FILL — BACITRAYCIN PLUS 500 UNIT/GM EX OINT: 500 UNIT/GM | CUTANEOUS | Qty: 28

## 2020-07-10 MED FILL — PROPOFOL 500 MG/50ML IV EMUL: 500 MG/50ML | INTRAVENOUS | Qty: 50

## 2020-07-10 MED FILL — CEFAZOLIN 2000 MG IN 20 ML SWFI IV SYRINGE (PREMIX): Qty: 2000

## 2020-07-10 MED FILL — FENTANYL CITRATE (PF) 100 MCG/2ML IJ SOLN: 100 MCG/2ML | INTRAMUSCULAR | Qty: 2

## 2020-07-10 NOTE — Anesthesia Post-Procedure Evaluation (Signed)
Department of Anesthesiology  Postprocedure Note    Patient: Tim [name]  MRN: [id number]  Birthdate: [birth year]  Date of evaluation: 07/10/2020  Time:  9:36 AM     Procedure Summary     Date: 07/10/20 Room / Location: [facility] OR 10 / [facility]    Anesthesia Start: [phone] Anesthesia Stop: 1848    Procedure: EXCISION OF FOREIGN BODY LEFT CHEEK  ++LATEX ALLERGY++   ++IODINE ALLERGY++ (Left Face) Diagnosis: (FOREIGN BODY)    Surgeons: Trude Mcburney, DO Responsible Provider: Coralyn Pear, MD    Anesthesia Type: MAC ASA Status: 3          Anesthesia Type: MAC    Aldrete Phase I: Aldrete Score: 10    Aldrete Phase II:      Last vitals: Reviewed and per EMR flowsheets.       Anesthesia Post Evaluation    Patient location during evaluation: PACU  Patient participation: complete - patient participated  Level of consciousness: awake and alert  Airway patency: patent  Nausea & Vomiting: no nausea and no vomiting  Complications: no  Cardiovascular status: blood pressure returned to baseline  Respiratory status: acceptable and room air  Hydration status: euvolemic

## 2020-07-10 NOTE — Discharge Instructions (Signed)
SURGERY DISCHARGE INSTRUCTIONS    You may be drowsy or lightheaded after receiving sedation or anesthesia.    A responsible person should be with you for the next 24 hours.     FOLLOW UP: Call office to schedule follow-up appointment in 2 weeks.     DIET: Advance your diet as tolerated. Start with light diet and progress to your normal diet as you feel like eating. If you experience nausea or repeated episodes of vomiting which persist beyond 12-24 hours, notify your doctor.      ACTIVITY: Rest today. Increase activity gradually.  No driving while on prescription pain medication. No heavy lifting for 4 weeks - nothing over 10 lbs, or heavier than a milk jug.     SHOWER/BATHING: Okay to shower in 24 hours after procedure. No tub bathing, swimming or soaking for 2 weeks.     WOUND CARE: You have absorbable sutures on your incision. Apply ophthalmic bacitracin three times per day. Aside from ophthalmic antibiotic ointment, avoid directly applying lotions, creams or oils to your incision. Keep incision clean and dry. Always ensure you and your care giver clean hands before and after caring for the wound. You may place ice on incisions to decrease the pain and bruising.     MEDICATIONS: Use ice packs for pain. You may also take over the counter ibuprofen or tylenol for pain as directed, limit total amount of acetaminophen (tylenol) to 3 grams per 24-hour period. Okay to resume anticoagulant medication after 24hrs.     SPECIAL INSTRUCTIONS:   Call physician for any of the following or for questions/concerns:    Fever over 101 F                     Redness, swelling, hardness or warmth at the wound site (s)   Unrelieved nausea/vomiting                           Foul smelling or cloudy drainage at the wound site (s)      Sudden increase in bloody drainage that does not stop   Unrelieved pain or increase in pain                           Increase in shortness of breath

## 2020-07-10 NOTE — Op Note (Signed)
Operative Note      Patient: Tim [name]  Date of Birth: 23-Sep-1955  MRN: [id number]    Date of Procedure: 07/10/2020    Pre-Op Diagnosis: Left cheek mass    Post-Op Diagnosis: Left cheek mass       Procedure(s):  Excision soft tissue mass left cheek    Surgeon(s):  Heywood Bene, DO    Assistant:   Resident: Katharina Caper, MD; Eloisa Northern, MD    Anesthesia: Monitor Anesthesia Care    Estimated Blood Loss (mL): Minimal    Complications: None    Specimens:   ID Type Source Tests Collected by Time Destination   A : LEFT CHEEK SOFT TISSUE MASS Tissue Tissue SURGICAL PATHOLOGY Heywood Bene, DO 07/10/2020 1751        Implants:  * No implants in log *      Drains: * No LDAs found *    Findings: 2 cm soft tissue mass left cheek    Detailed Description of Procedure:     INDICATION: Patient was no longer able to tolerate left eye mass as it caused discomfort when ever sleeping on that side. Informed consent was obtained for excision.     DESCRIPTION OF PROCEDURE: The patient was brought into the operating room suite, placed supine, and sedated with propofol. Timeout was completed to confirm patient identity, surgery, and site.  After adequate anesthesia, the patient was prepped with ophthalmic betadine and draped in the standard surgical fashion.  Ancef 2gram IV were given.      Total 22mL of 1% lidocaine with epi were injected around the mass lateral to the left eye.  Elliptical incision made directly overlying the mass.  15 blade and metz were used to completely excise the mass deep within the soft tissues down to the level of fascia.  It was approximate 2 cm in maximal diameter.  Wound was irrigated, and three simple interrupted sutures with 6-0 chromic were placed to close the wound and achieve hemostasis.    Needle, sponge, and instrument counts were reported as correct x2. The patient tolerated the procedure and awoke from anesthesia uneventfully. He was transferred to the recovery area in satisfactory  condition.    Dr Maisie Fus was present for entire case.      Electronically signed by Katharina Caper, MD on 07/10/2020 at 9:34 AM    Attestation:    I was present during the procedure and participated in all key aspects.    Heywood Bene, DO  07/10/20  11:29 AM
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
## 2020-07-10 NOTE — Progress Notes (Signed)
Seizure precautions initiated

## 2020-07-10 NOTE — Progress Notes (Signed)
Patient awake and alert, no complaints of pain, nausea or vomiting. Discharge instructions reviewed with patient and responsible person. Sedation form provided in instructions. Questions answered. Patient will be discharged home with responsible adult.

## 2020-07-10 NOTE — Progress Notes (Signed)
Asked patient to specify iodine allergy. Patient informed staff allergy to iodine was to the IV, not topical.
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
## 2020-07-27 ENCOUNTER — Ambulatory Visit: Admit: 2020-07-27 | Discharge: 2020-07-27 | Payer: MEDICARE | Attending: Surgery | Primary: Nurse Practitioner

## 2020-07-27 DIAGNOSIS — Z09 Encounter for follow-up examination after completed treatment for conditions other than malignant neoplasm: Secondary | ICD-10-CM

## 2020-07-27 NOTE — Progress Notes (Signed)
Patient presents for postop visit.  He has no complaints per exam incision is well-healed.  Pathology consistent with benign cyst.  He is doing well.  Is welcome to follow-up as needed.

## 2020-08-13 ENCOUNTER — Encounter: Admit: 2020-08-13 | Discharge: 2020-08-13 | Payer: MEDICARE | Primary: Nurse Practitioner

## 2020-08-13 ENCOUNTER — Encounter

## 2020-08-13 DIAGNOSIS — Z79899 Other long term (current) drug therapy: Secondary | ICD-10-CM

## 2020-08-13 NOTE — Progress Notes (Signed)
Right anicbuicle  Venipuncture with butterfly needle

## 2020-08-14 LAB — COMPREHENSIVE METABOLIC PANEL, FASTING
ALT: 8 U/L (ref 0–40)
AST: 16 U/L (ref 0–39)
Albumin: 3.7 g/dL (ref 3.5–5.2)
Alkaline Phosphatase: 206 U/L — ABNORMAL HIGH (ref 40–129)
Anion Gap: 12 mmol/L (ref 7–16)
BUN: 14 mg/dL (ref 6–23)
CO2: 22 mmol/L (ref 22–29)
Calcium: 8.6 mg/dL (ref 8.6–10.2)
Chloride: 106 mmol/L (ref 98–107)
Creatinine: 0.9 mg/dL (ref 0.7–1.2)
GFR African American: >60
GFR Non-African American: >60 mL/min/{1.73_m2} (ref >=60)
Glucose, Fasting: 101 mg/dL — ABNORMAL HIGH (ref 74–99)
Potassium: 3.9 mmol/L (ref 3.5–5.0)
Sodium: 140 mmol/L (ref 132–146)
Total Bilirubin: <0.2 mg/dL (ref 0.0–1.2)
Total Protein: 7.1 g/dL (ref 6.4–8.3)

## 2020-08-14 LAB — CBC WITH AUTO DIFFERENTIAL
Basophils %: 0.5 % (ref 0.0–2.0)
Basophils Absolute: 0.03 E9/L (ref 0.00–0.20)
Eosinophils %: 3.6 % (ref 0.0–6.0)
Eosinophils Absolute: 0.23 E9/L (ref 0.05–0.50)
Hematocrit: 44.5 % (ref 37.0–54.0)
Hemoglobin: 14.5 g/dL (ref 12.5–16.5)
Immature Granulocytes #: 0.01 E9/L
Immature Granulocytes %: 0.2 % (ref 0.0–5.0)
Lymphocytes %: 29.4 % (ref 20.0–42.0)
Lymphocytes Absolute: 1.88 E9/L (ref 1.50–4.00)
MCH: 32.2 pg (ref 26.0–35.0)
MCHC: 32.6 % (ref 32.0–34.5)
MCV: 98.9 fL (ref 80.0–99.9)
MPV: 10.2 fL (ref 7.0–12.0)
Monocytes %: 6.1 % (ref 2.0–12.0)
Monocytes Absolute: 0.39 E9/L (ref 0.10–0.95)
Neutrophils %: 60.2 % (ref 43.0–80.0)
Neutrophils Absolute: 3.85 E9/L (ref 1.80–7.30)
Platelets: 237 E9/L (ref 130–450)
RBC: 4.5 E12/L (ref 3.80–5.80)
RDW: 14 fL (ref 11.5–15.0)
WBC: 6.4 E9/L (ref 4.5–11.5)

## 2020-08-14 LAB — LIPID PANEL
Cholesterol, Total: 143 mg/dL (ref 0–199)
HDL: 28 mg/dL (ref >40)
LDL Calculated: 76 mg/dL (ref 0–99)
Triglycerides: 195 mg/dL — ABNORMAL HIGH (ref 0–149)
VLDL Cholesterol Calculated: 39 mg/dL

## 2020-08-14 LAB — PHENYTOIN LEVEL, TOTAL: Phenytoin Lvl: 2.6 ug/mL — ABNORMAL LOW (ref 10.0–20.0)

## 2020-08-24 ENCOUNTER — Ambulatory Visit
Admit: 2020-08-24 | Discharge: 2020-08-24 | Payer: MEDICARE | Attending: Nurse Practitioner | Primary: Nurse Practitioner

## 2020-08-24 ENCOUNTER — Ambulatory Visit: Admit: 2020-08-24 | Discharge: 2020-08-24 | Payer: MEDICARE | Primary: Nurse Practitioner

## 2020-08-24 DIAGNOSIS — M79642 Pain in left hand: Secondary | ICD-10-CM
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
## 2020-08-24 NOTE — Patient Instructions (Signed)
Patient Education        Body Mass Index: Care Instructions  Your Care Instructions     Body mass index (BMI) can help you see if your weight is raising your risk for health problems. It uses a formula to compare how much you weigh with how tall you are.  ?? A BMI lower than 18.5 is considered underweight.  ?? A BMI between 18.5 and 24.9 is considered healthy.  ?? A BMI between 25 and 29.9 is considered overweight. A BMI of 30 or higher is considered obese.  If your BMI is in the normal range, it means that you have a lower risk for weight-related health problems. If your BMI is in the overweight or obese range, you may be at increased risk for weight-related health problems, such as high blood pressure, heart disease, stroke, arthritis or joint pain, and diabetes. If your BMI is in the underweight range, you may be at increased risk for health problems such as fatigue, lower protection (immunity) against illness, muscle loss, bone loss, hair loss, and hormone problems.  BMI is just one measure of your risk for weight-related health problems. You may be at higher risk for health problems if you are not active, you eat an unhealthy diet, or you drink too much alcohol or use tobacco products.  Follow-up care is a key part of your treatment and safety. Be sure to make and go to all appointments, and call your doctor if you are having problems. It's also a good idea to know your test results and keep a list of the medicines you take.  How can you care for yourself at home?  ?? Practice healthy eating habits. This includes eating plenty of fruits, vegetables, whole grains, lean protein, and low-fat dairy.  ?? If your doctor recommends it, get more exercise. Walking is a good choice. Bit by bit, increase the amount you walk every day. Try for at least 30 minutes on most days of the week.  ?? Do not smoke. Smoking can increase your risk for health problems. If you need help quitting, talk to your doctor about stop-smoking  programs and medicines. These can increase your chances of quitting for good.  ?? Limit alcohol to 2 drinks a day for men and 1 drink a day for women. Too much alcohol can cause health problems.  If you have a BMI higher than 25  ?? Your doctor may do other tests to check your risk for weight-related health problems. This may include measuring the distance around your waist. A waist measurement of more than 40 inches in men or 35 inches in women can increase the risk of weight-related health problems.  ?? Talk with your doctor about steps you can take to stay healthy or improve your health. You may need to make lifestyle changes to lose weight and stay healthy, such as changing your diet and getting regular exercise.  If you have a BMI lower than 18.5  ?? Your doctor may do other tests to check your risk for health problems.  ?? Talk with your doctor about steps you can take to stay healthy or improve your health. You may need to make lifestyle changes to gain or maintain weight and stay healthy, such as getting more healthy foods in your diet and doing exercises to build muscle.  Where can you learn more?  Go to [email or web address] and sign in to your MyChart account. Enter S176 in the Search Health Information box to learn   more about "Body Mass Index: Care Instructions."     If you do not have an account, please click on the "Sign Up Now" link.  Current as of: October 30, 2019??????????????????????????????Content Version: 13.1  ?? 2006-2021 [employer].   Care instructions adapted under license by [facility]. If you have questions about a medical condition or this instruction, always ask your healthcare professional. [employer] any warranty or liability for your use of this information.         Patient Education        Chronic Pain: Care Instructions  Your Care Instructions     Chronic pain is pain that lasts a long time (months or even years) and may or may not have a clear  cause. It is different from acute pain, which usually does have a clear cause--like an injury or illness--and gets better over time. Chronic pain:  ?? Lasts over time but may vary from day to day.  ?? Does not go away despite efforts to end it.  ?? May disrupt your sleep and lead to fatigue.  ?? May cause depression or anxiety.  ?? May make your muscles tense, causing more pain.  ?? Can disrupt your work, hobbies, home life, and relationships with friends and family.  Chronic pain is a very real condition. It is not just in your head. Treatment can help and usually includes several methods used together, such as medicines, physical therapy, exercise, and other treatments. Learning how to relax and changing negative thought patterns can also help you cope.  Chronic pain is complex. Taking an active role in your treatment will help you better manage your pain. Tell your doctor if you have trouble dealing with your pain. You may have to try several things before you find what works best for you.  Follow-up care is a key part of your treatment and safety. Be sure to make and go to all appointments, and call your doctor if you are having problems. It's also a good idea to know your test results and keep a list of the medicines you take.  How can you care for yourself at home?  ?? Pace yourself. Break up large jobs into smaller tasks. Save harder tasks for days when you have less pain, or go back and forth between hard tasks and easier ones. Take rest breaks.  ?? Relax, and reduce stress. Relaxation techniques such as deep breathing or meditation can help.  ?? Keep moving. Gentle, daily exercise can help reduce pain over the long run. Try low- or no-impact exercises such as walking, swimming, and stationary biking. Do stretches to stay flexible.  ?? Try heat, cold packs, and massage.  ?? Get enough sleep. Chronic pain can make you tired and drain your energy. Talk with your doctor if you have trouble sleeping because of  pain.  ?? Think positive. Your thoughts can affect your pain level. Do things that you enjoy to distract yourself when you have pain instead of focusing on the pain. See a movie, read a book, listen to music, or spend time with a friend.  ?? If you think you are depressed, talk to your doctor about treatment.  ?? Keep a daily pain diary. Record how your moods, thoughts, sleep patterns, activities, and medicine affect your pain. You may find that your pain is worse during or after certain activities or when you are feeling a certain emotion. Having a record of your pain can help you and your doctor  find the best ways to treat your pain.  ?? Take pain medicines exactly as directed.  ? If the doctor gave you a prescription medicine for pain, take it as prescribed.  ? If you are not taking a prescription pain medicine, ask your doctor if you can take an over-the-counter medicine.  Reducing constipation caused by pain medicine  ?? Talk to your doctor about a laxative. If a laxative doesn't work, your doctor may suggest a prescription medicine.  ?? Include fruits, vegetables, beans, and whole grains in your diet each day. These foods are high in fiber.  ?? If your doctor recommends it, get more exercise. Walking is a good choice. Bit by bit, increase the amount you walk every day. Try for at least 30 minutes on most days of the week.  ?? Schedule time each day for a bowel movement. A daily routine may help. Take your time and do not strain when having a bowel movement.  When should you call for help?   Call your doctor now or seek immediate medical care if:  ?? ?? Your pain gets worse or is out of control.   ?? ?? You feel down or blue, or you do not enjoy things like you once did. You may be depressed, which is common in people with chronic pain. Depression can be treated.   ?? ?? You have vomiting or cramps for more than 2 hours.   Watch closely for changes in your health, and be sure to contact your doctor if:  ?? ?? You cannot sleep  because of pain.   ?? ?? You are very worried or anxious about your pain.   ?? ?? You have trouble taking your pain medicine.   ?? ?? You have any concerns about your pain medicine.   ?? ?? You have trouble with bowel movements, such as:  ? No bowel movement in 3 days.  ? Blood in the anal area, in your stool, or on the toilet paper.  ? Diarrhea for more than 24 hours.   Where can you learn more?  Go to [email or web address] and sign in to your MyChart account. Enter N004 in the Search Health Information box to learn more about "Chronic Pain: Care Instructions."     If you do not have an account, please click on the "Sign Up Now" link.  Current as of: November 21, 2019??????????????????????????????Content Version: 13.1  ?? 2006-2021 [employer].   Care instructions adapted under license by [facility]. If you have questions about a medical condition or this instruction, always ask your healthcare professional. [employer] disclaims any warranty or liability for your use of this information.         Patient Education        Learning About Epilepsy  What is epilepsy?     Epilepsy is a common condition that causes repeated seizures. Seizures may cause problems with muscle control, movement, speech, vision, or awareness. They usually don't last very long, but they can be scary. Treatment usually works to control and reduce seizures.  What causes it?  Many things can cause epilepsy. It may develop as a result of a head injury or a condition that causes damage to the brain, like a tumor or stroke. Genes may also play a role. But you don't have to have a family history to develop it. Often doctors don't know what causes epilepsy.  What are the symptoms?  The main symptom of epilepsy is repeated seizures that happen  without warning. There are different kinds of seizures. You may notice strange smells or sounds. You may lose control of your muscles. Or your body may twitch or jerk. Your symptoms will depend  on the type of seizure you have.  How is it diagnosed?  Diagnosing epilepsy can be hard. Your doctor will ask questions to find out what happened just before, during, and right after a seizure. Your doctor will examine you. You'll have some tests, such as an electroencephalogram. This information can help your doctor decide what kind of seizures you have and if you have epilepsy.  How is it treated?  You can take medicines to control and reduce seizures. Which type you use depends on the type of seizure. You and your doctor will need to find the right combination, schedule, and dose of medicine. If medicine alone doesn't help, your doctor may suggest a special diet or surgery to help reduce seizures.  How can you care for yourself at home?  To control your seizures, you need to follow your treatment plan. If you take medicine to control seizures, you must take it exactly as prescribed.  The medicine works only if you take the right amount on the schedule your doctor sets up. Following this schedule keeps the right level of medicine in your body. Even missing just a few doses can allow seizures to happen.  You might be on a special ketogenic diet. If so, you'll need to follow the diet exactly for it to help prevent seizures.  As you follow your treatment plan, also try to figure out and avoid things that may make you more likely to have a seizure. These may include:  ?? Not getting enough sleep.  ?? Using drugs or alcohol.  ?? Being stressed.  ?? Skipping meals.  If you keep having seizures despite treatment, keep a record of them. Note the date, time of day, and any details about the seizure that you can remember. Your doctor can use this information to plan or adjust your medicine or other treatment. The record can also help your doctor find out what kinds of seizures you are having.  If you have epilepsy:  ?? Be sure that any doctor who treats you knows that you have epilepsy. And let the doctor know what medicines you  take, if any.  ?? Wear a medical ID bracelet. If you have a seizure or accident that leaves you unconscious or unable to speak for yourself, the bracelet will let those who are treating you know that you have epilepsy. It will also list any medicines you take to control your seizures. That way, you won't be given any medicines that will react badly with those already in your body.  ?? Ask your doctor if it's safe for you to do things like drive or swim.  ?? Create a seizure first-aid plan with your friends and family. The plan will help them know how to help you. The kind of plan you need can depend on the kind of seizures you have. Your doctor can tell you more about this.  Follow-up care is a key part of your treatment and safety. Be sure to make and go to all appointments, and call your doctor if you are having problems. It's also a good idea to know your test results and keep a list of the medicines you take.  Where can you learn more?  Go to [email or web address] and sign in to your MyChart account. Enter E175 in the  Search Health Information box to learn more about "Learning About Epilepsy."     If you do not have an account, please click on the "Sign Up Now" link.  Current as of: November 21, 2019??????????????????????????????Content Version: 13.1  ?? 2006-2021 [employer].   Care instructions adapted under license by [facility]. If you have questions about a medical condition or this instruction, always ask your healthcare professional. [employer] disclaims any warranty or liability for your use of this information.         Patient Education        Hand Pain: Care Instructions  Your Care Instructions     Common causes of hand pain are overuse and injuries, such as might happen during sports or home repair projects. Everyday wear and tear, especially as you get older, also can cause hand pain.  Most minor hand injuries will heal on their own, and home treatment is usually all you need  to do. If you have sudden and severe pain, you may need tests and treatment.  Follow-up care is a key part of your treatment and safety. Be sure to make and go to all appointments, and call your doctor if you are having problems. It's also a good idea to know your test results and keep a list of the medicines you take.  How can you care for yourself at home?  ?? Take pain medicines exactly as directed.  ? If the doctor gave you a prescription medicine for pain, take it as prescribed.  ? If you are not taking a prescription pain medicine, ask your doctor if you can take an over-the-counter medicine.  ?? Rest and protect your hand. Take a break from any activity that may cause pain.  ?? Put ice or a cold pack on your hand for 10 to 20 minutes at a time. Put a thin cloth between the ice and your skin.  ?? Prop up the sore hand on a pillow when you ice it or anytime you sit or lie down during the next 3 days. Try to keep it above the level of your heart. This will help reduce swelling.  ?? If your doctor recommends a sling, splint, or elastic bandage to support your hand, wear it as directed.  When should you call for help?   Call 911 anytime you think you may need emergency care. For example, call if:  ?? ?? Your hand turns cool or pale or changes color.   Call your doctor now or seek immediate medical care if:  ?? ?? You cannot move your hand.   ?? ?? Your hand pops, moves out of its normal position, and then returns to its normal position.   ?? ?? You have signs of infection, such as:  ? Increased pain, swelling, warmth, or redness.  ? Red streaks leading from the sore area.  ? Pus draining from a place on your hand.  ? A fever.   ?? ?? Your hand feels numb or tingly.   Watch closely for changes in your health, and be sure to contact your doctor if:  ?? ?? Your hand feels unstable when you try to use it.   ?? ?? You do not get better as expected.   ?? ?? You have any new symptoms, such as swelling.   ?? ?? Bruises from an injury to your  hand last longer than 2 weeks.   Where can you learn more?  Go to [email or web address] and sign in  to your MyChart account. Enter R273 in the Search Health Information box to learn more about "Hand Pain: Care Instructions."     If you do not have an account, please click on the "Sign Up Now" link.  Current as of: February 13, 2020??????????????????????????????Content Version: 13.1  ?? 2006-2021 [employer].   Care instructions adapted under license by [facility]. If you have questions about a medical condition or this instruction, always ask your healthcare professional. [employer] disclaims any warranty or liability for your use of this information.         Patient Education        Joint Pain: Care Instructions  Your Care Instructions     Many people have small aches and pains from overuse or injury to muscles and joints. Joint injuries often happen during sports or recreation, work tasks, or projects around the home. An overuse injury can happen when you put too much stress on a joint or when you do an activity that stresses the joint over and over, such as using the computer or rowing a boat.  You can take action at home to help your muscles and joints get better. You should feel better in 1 to 2 weeks, but it can take 3 months or more to heal completely.  Follow-up care is a key part of your treatment and safety. Be sure to make and go to all appointments, and call your doctor if you are having problems. It's also a good idea to know your test results and keep a list of the medicines you take.  How can you care for yourself at home?  ?? Do not put weight on the injured joint for at least a day or two.  ?? For the first day or two after an injury, do not take hot showers or baths, and do not use hot packs. The heat could make swelling worse.  ?? Put ice or a cold pack on the sore joint for 10 to 20 minutes at a time. Try to do this every 1 to 2 hours for the next 3 days (when you are  awake) or until the swelling goes down. Put a thin cloth between the ice and your skin.  ?? Wrap the injury in an elastic bandage. Do not wrap it too tightly because this can cause more swelling.  ?? Prop up the sore joint on a pillow when you ice it or anytime you sit or lie down during the next 3 days. Try to keep it above the level of your heart. This will help reduce swelling.  ?? Take an over-the-counter pain medicine, such as acetaminophen (Tylenol), ibuprofen (Advil, Motrin), or naproxen (Aleve). Read and follow all instructions on the label.  ?? After 1 or 2 days of rest, begin moving the joint gently. While the joint is still healing, you can begin to exercise using activities that do not strain or hurt the painful joint.  When should you call for help?   Call your doctor now or seek immediate medical care if:  ?? ?? You have signs of infection, such as:  ? Increased pain, swelling, warmth, and redness.  ? Red streaks leading from the joint.  ? A fever.   Watch closely for changes in your health, and be sure to contact your doctor if:  ?? ?? Your movement or symptoms are not getting better after 1 to 2 weeks of home treatment.   Where can you learn more?  Go to  [email or web address] and sign in to your MyChart account. Enter P205 in the Search Health Information box to learn more about "Joint Pain: Care Instructions."     If you do not have an account, please click on the "Sign Up Now" link.  Current as of: February 13, 2020??????????????????????????????Content Version: 13.1  ?? 2006-2021 [employer].   Care instructions adapted under license by [facility]. If you have questions about a medical condition or this instruction, always ask your healthcare professional. [employer] disclaims any warranty or liability for your use of this information.

## 2020-10-08 ENCOUNTER — Encounter

## 2020-10-08 NOTE — Telephone Encounter (Signed)
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

## 2020-10-08 NOTE — Telephone Encounter (Signed)
I cant read it is there any way to make it clearer?

## 2020-10-08 NOTE — Telephone Encounter (Signed)
I scanned in a second attachment, the second one is the clear report attached to your order, theres two blue links click the one that says clear report.  :)

## 2020-10-08 NOTE — Telephone Encounter (Signed)
Patient called in requesting his EMG results. They were scanned in under media and not attached to your order which is why you didnt see them sooner, scanned and attached them to your order, please review and advise at your earliest convenience.

## 2020-10-09 NOTE — Telephone Encounter (Signed)
Left voicemail for patient to call back at his earliest convenience.

## 2020-10-09 NOTE — Telephone Encounter (Signed)
Relayed providers response to patient, patient verbalized understanding and confirmed he had no questions.

## 2020-10-14 ENCOUNTER — Encounter

## 2020-10-14 ENCOUNTER — Encounter: Admit: 2020-10-14 | Discharge: 2020-10-14 | Payer: MEDICARE | Primary: Nurse Practitioner

## 2020-10-15 LAB — CBC WITH AUTO DIFFERENTIAL
Basophils %: 0.9 % (ref 0.0–2.0)
Basophils Absolute: 0.04 E9/L (ref 0.00–0.20)
Eosinophils %: 4.1 % (ref 0.0–6.0)
Eosinophils Absolute: 0.18 E9/L (ref 0.05–0.50)
Hematocrit: 41.9 % (ref 37.0–54.0)
Hemoglobin: 13.7 g/dL (ref 12.5–16.5)
Immature Granulocytes #: 0 E9/L
Immature Granulocytes %: 0 % (ref 0.0–5.0)
Lymphocytes %: 45.9 % — ABNORMAL HIGH (ref 20.0–42.0)
Lymphocytes Absolute: 2.02 E9/L (ref 1.50–4.00)
MCH: 32.5 pg (ref 26.0–35.0)
MCHC: 32.7 % (ref 32.0–34.5)
MCV: 99.5 fL (ref 80.0–99.9)
MPV: 10.3 fL (ref 7.0–12.0)
Monocytes %: 10.9 % (ref 2.0–12.0)
Monocytes Absolute: 0.48 E9/L (ref 0.10–0.95)
Neutrophils %: 38.2 % — ABNORMAL LOW (ref 43.0–80.0)
Neutrophils Absolute: 1.68 E9/L — ABNORMAL LOW (ref 1.80–7.30)
Platelets: 237 E9/L (ref 130–450)
RBC: 4.21 E12/L (ref 3.80–5.80)
RDW: 13.8 fL (ref 11.5–15.0)
WBC: 4.4 E9/L — ABNORMAL LOW (ref 4.5–11.5)

## 2020-10-15 LAB — LIPID PANEL
Cholesterol, Total: 132 mg/dL (ref 0–199)
HDL: 28 mg/dL (ref >40)
LDL Calculated: 80 mg/dL (ref 0–99)
Triglycerides: 119 mg/dL (ref 0–149)
VLDL Cholesterol Calculated: 24 mg/dL

## 2020-10-15 LAB — COMPREHENSIVE METABOLIC PANEL, FASTING
ALT: 9 U/L (ref 0–40)
AST: 18 U/L (ref 0–39)
Albumin: 3.8 g/dL (ref 3.5–5.2)
Alkaline Phosphatase: 219 U/L — ABNORMAL HIGH (ref 40–129)
Anion Gap: 13 mmol/L (ref 7–16)
BUN: 6 mg/dL (ref 6–23)
CO2: 21 mmol/L — ABNORMAL LOW (ref 22–29)
Calcium: 8.6 mg/dL (ref 8.6–10.2)
Chloride: 109 mmol/L — ABNORMAL HIGH (ref 98–107)
Creatinine: 0.9 mg/dL (ref 0.7–1.2)
GFR African American: >60
GFR Non-African American: >60 mL/min/{1.73_m2} (ref >=60)
Glucose, Fasting: 117 mg/dL — ABNORMAL HIGH (ref 74–99)
Potassium: 4.1 mmol/L (ref 3.5–5.0)
Sodium: 143 mmol/L (ref 132–146)
Total Bilirubin: 0.2 mg/dL (ref 0.0–1.2)
Total Protein: 6.9 g/dL (ref 6.4–8.3)

## 2020-10-15 LAB — PHENYTOIN LEVEL, TOTAL: Phenytoin Lvl: 2.2 ug/mL — ABNORMAL LOW (ref 10.0–20.0)

## 2020-10-16 ENCOUNTER — Encounter: Attending: Nurse Practitioner | Primary: Nurse Practitioner

## 2020-10-23 ENCOUNTER — Ambulatory Visit
Admit: 2020-10-23 | Discharge: 2020-10-23 | Payer: MEDICARE | Attending: Nurse Practitioner | Primary: Nurse Practitioner

## 2020-10-23 DIAGNOSIS — M25512 Pain in left shoulder: Secondary | ICD-10-CM

## 2020-10-23 MED ORDER — IBUPROFEN 200 MG PO TABS
200 MG | ORAL_TABLET | Freq: Four times a day (QID) | ORAL | 0 refills | Status: DC | PRN
Start: 2020-10-23 — End: 2020-11-16

## 2020-10-23 MED ORDER — ACETAMINOPHEN 500 MG PO TABS
500 MG | ORAL_TABLET | Freq: Four times a day (QID) | ORAL | 0 refills | Status: AC | PRN
Start: 2020-10-23 — End: ?

## 2020-10-23 MED ORDER — OMEPRAZOLE 20 MG PO CPDR
20 MG | ORAL_CAPSULE | Freq: Two times a day (BID) | ORAL | 3 refills | Status: DC
Start: 2020-10-23 — End: 2021-05-31

## 2020-10-23 NOTE — Patient Instructions (Signed)
Patient Education        Chronic Pain: Care Instructions  Your Care Instructions     Chronic pain is pain that lasts a long time (months or even years) and may or may not have a clear cause. It is different from acute pain, which usually does have a clear cause--like an injury or illness--and gets better over time. Chronic pain:  ?? Lasts over time but may vary from day to day.  ?? Does not go away despite efforts to end it.  ?? May disrupt your sleep and lead to fatigue.  ?? May cause depression or anxiety.  ?? May make your muscles tense, causing more pain.  ?? Can disrupt your work, hobbies, home life, and relationships with friends and family.  Chronic pain is a very real condition. It is not just in your head. Treatment can help and usually includes several methods used together, such as medicines, physical therapy, exercise, and other treatments. Learning how to relax and changing negative thought patterns can also help you cope.  Chronic pain is complex. Taking an active role in your treatment will help you better manage your pain. Tell your Kendle if you have trouble dealing with your pain. You may have to try several things before you find what works best for you.  Follow-up care is a key part of your treatment and safety. Be sure to make and go to all appointments, and call your Rone if you are having problems. It's also a good idea to know your test results and keep a list of the medicines you take.  How can you care for yourself at home?  ?? Pace yourself. Break up large jobs into smaller tasks. Save harder tasks for days when you have less pain, or go back and forth between hard tasks and easier ones. Take rest breaks.  ?? Relax, and reduce stress. Relaxation techniques such as deep breathing or meditation can help.  ?? Keep moving. Gentle, daily exercise can help reduce pain over the long run. Try low- or no-impact exercises such as walking, swimming, and stationary biking. Do stretches to stay  flexible.  ?? Try heat, cold packs, and massage.  ?? Get enough sleep. Chronic pain can make you tired and drain your energy. Talk with your Fuhrer if you have trouble sleeping because of pain.  ?? Think positive. Your thoughts can affect your pain level. Do things that you enjoy to distract yourself when you have pain instead of focusing on the pain. See a movie, read a book, listen to music, or spend time with a friend.  ?? If you think you are depressed, talk to your Dunwoody about treatment.  ?? Keep a daily pain diary. Record how your moods, thoughts, sleep patterns, activities, and medicine affect your pain. You may find that your pain is worse during or after certain activities or when you are feeling a certain emotion. Having a record of your pain can help you and your Chaudhary find the best ways to treat your pain.  ?? Take pain medicines exactly as directed.  ? If the Glander gave you a prescription medicine for pain, take it as prescribed.  ? If you are not taking a prescription pain medicine, ask your Lieser if you can take an over-the-counter medicine.  Reducing constipation caused by pain medicine  ?? Talk to your Shoults about a laxative. If a laxative doesn't work, your Fristoe may suggest a prescription medicine.  ?? Include fruits, vegetables, beans, and   whole grains in your diet each day. These foods are high in fiber.  ?? If your doctor recommends it, get more exercise. Walking is a good choice. Bit by bit, increase the amount you walk every day. Try for at least 30 minutes on most days of the week.  ?? Schedule time each day for a bowel movement. A daily routine may help. Take your time and do not strain when having a bowel movement.  When should you call for help?   Call your doctor now or seek immediate medical care if:  ?? ?? Your pain gets worse or is out of control.   ?? ?? You feel down or blue, or you do not enjoy things like you once did. You may be depressed, which is common in people with chronic pain.  Depression can be treated.   ?? ?? You have vomiting or cramps for more than 2 hours.   Watch closely for changes in your health, and be sure to contact your doctor if:  ?? ?? You cannot sleep because of pain.   ?? ?? You are very worried or anxious about your pain.   ?? ?? You have trouble taking your pain medicine.   ?? ?? You have any concerns about your pain medicine.   ?? ?? You have trouble with bowel movements, such as:  ? No bowel movement in 3 days.  ? Blood in the anal area, in your stool, or on the toilet paper.  ? Diarrhea for more than 24 hours.   Where can you learn more?  Go to [email or web address] and sign in to your MyChart account. Enter N004 in the Search Health Information box to learn more about "Chronic Pain: Care Instructions."     If you do not have an account, please click on the "Sign Up Now" link.  Current as of: November 21, 2019??????????????????????????????Content Version: 13.1  ?? 2006-2021 [employer].   Care instructions adapted under license by [facility]. If you have questions about a medical condition or this instruction, always ask your healthcare professional. [employer] disclaims any warranty or liability for your use of this information.         Patient Education        Musculoskeletal Pain: Care Instructions  Your Care Instructions     Different problems with the bones, muscles, nerves, ligaments, and tendons in the body can cause pain. One or more areas of your body may ache or burn. Or they may feel tired, stiff, or sore.  The medical term for this type of pain is musculoskeletal pain. It can have many different causes.  Sometimes the pain is caused by an injury such as a strain or sprain. Or you might have pain from using one part of your body in the same way over and over again. This is called overuse.  In some cases, the cause of the pain is another health problem such as arthritis or fibromyalgia.  The doctor will examine you and ask you questions about  your health to help find the cause of your pain. Blood tests or imaging tests like an X-ray may also be helpful. But sometimes doctors can't find a cause of the pain.  Treatment depends on your symptoms and the cause of the pain, if known.  The doctor has checked you carefully, but problems can develop later. If you notice any problems or new symptoms, get medical treatment right away.  Follow-up care is a key part of your  treatment and safety. Be sure to make and go to all appointments, and call your doctor if you are having problems. It's also a good idea to know your test results and keep a list of the medicines you take.  How can you care for yourself at home?  ?? If you have new pain:  ? Rest until you feel better.  ? Do not do anything that makes the pain worse. Return to exercise gradually if you feel better and your doctor says it's okay.  ?? Be safe with medicines. Read and follow all instructions on the label.  ? If the doctor gave you a prescription medicine for pain, take it as prescribed.  ? If you are not taking a prescription pain medicine, ask your doctor if you can take an over-the-counter medicine.  ?? Put heat or cold where your pain is, as needed. Use what helps you most. You can also switch between hot and cold packs.  ? Apply heat 2 or 3 times a day for 20 to 30 minutes. This can be done using a heating pad, hot shower, or hot pack to relieve pain and stiffness. But don't use heat on a newly swollen joint.  ? Put ice or a cold pack on the painful spot for 10 to 20 minutes at a time. Put a thin cloth between the ice and your skin.  When should you call for help?   Call your doctor now or seek immediate medical care if:  ?? ?? You have new pain, or your pain gets worse.   ?? ?? You have new symptoms such as a fever, a rash, or chills.   Watch closely for changes in your health, and be sure to contact your doctor if:  ?? ?? You do not get better as expected.   Where can you learn more?  Go to  [email or web address] and sign in to your MyChart account. Enter [phone] in the Search Health Information box to learn more about "Musculoskeletal Pain: Care Instructions."     If you do not have an account, please click on the "Sign Up Now" link.  Current as of: November 21, 2019??????????????????????????????Content Version: 13.1  ?? 2006-2021 [employer].   Care instructions adapted under license by [facility]. If you have questions about a medical condition or this instruction, always ask your healthcare professional. [employer] disclaims any warranty or liability for your use of this information.
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
## 2020-11-13 NOTE — Telephone Encounter (Addendum)
His wife said this patient is having numbness, tingling and pain technically on left side of the body and they are wondering if he can get in next week.    Also, they are wondering if he won't have a MRI of shoulder, he wants one.    Please advise.

## 2020-11-13 NOTE — Telephone Encounter (Signed)
-----   Message from Bonne Dolores sent at 11/13/2020  2:07 PM EDT -----  Subject: Message to Provider    QUESTIONS  Information for Provider? patient is requesting to have a call back in   regards to a medical question / request ; patient states that he received   a call indicating that there were two orders for a mri but it was   indicated that there were only one for cervical spine area so patient   would like a call back to discuss and also request to have a order for mri   of the left shoulder.   ---------------------------------------------------------------------------  --------------  Cleotis Lema INFO  What is the best way for the office to contact you? OK to leave message on   voicemail  Preferred Call Back Phone Number? [phone]  ---------------------------------------------------------------------------  --------------  SCRIPT ANSWERS  Relationship to Patient? Other  Representative Name? charlene  Is the Representative on the appropriate HIPAA document in Epic? Yes
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
## 2020-11-14 NOTE — Telephone Encounter (Signed)
Ok get him in

## 2020-11-16 ENCOUNTER — Ambulatory Visit
Admit: 2020-11-16 | Discharge: 2020-11-16 | Payer: MEDICARE | Attending: Nurse Practitioner | Primary: Nurse Practitioner

## 2020-11-16 DIAGNOSIS — M25512 Pain in left shoulder: Secondary | ICD-10-CM

## 2020-11-16 DIAGNOSIS — G8929 Other chronic pain: Secondary | ICD-10-CM

## 2020-11-16 MED ORDER — IBUPROFEN 200 MG PO TABS
200 MG | ORAL_TABLET | Freq: Four times a day (QID) | ORAL | 0 refills | Status: DC | PRN
Start: 2020-11-16 — End: 2021-05-31
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
## 2020-11-16 NOTE — Patient Instructions (Signed)
Patient Education        Arthritis: Care Instructions  Overview     Arthritis, also called osteoarthritis, is a breakdown of the cartilage that cushions your joints. When the cartilage wears down, your bones rub against each other. This causes pain and stiffness. Many people have some arthritis as they age. Arthritis most often affects the joints of the spine, hands, hips,knees, or feet.  Arthritis never goes away completely. But medicine and home treatment can helpreduce pain and help you stay active.  Follow-up care is a key part of your treatment and safety. Be sure to make and go to all appointments, and call your doctor if you are having problems. It's also a good idea to know your test results and keep alist of the medicines you take.  How can you care for yourself at home?  ??? Stay at a healthy weight. Being overweight puts extra strain on your joints.  ??? Talk to your doctor or physical therapist about exercises that will help ease joint pain.  ? Stretch. You may enjoy gentle forms of yoga to help keep your joints and muscles flexible.  ? Walk instead of jog. Other types of exercise that are less stressful on the joints include riding a bike, swimming, tai chi, or water exercise.  ? Lift weights. Strong muscles help reduce stress on your joints. Stronger thigh muscles, for example, take some of the stress off of the knees and hips. Learn the right way to lift weights so you don't make joint pain worse.  ??? Take your medicines exactly as prescribed. Call your doctor if you think you are having a problem with your medicine.  ??? Take pain medicines exactly as directed.  ? If the doctor gave you a prescription medicine for pain, take it as prescribed.  ? If you are not taking a prescription pain medicine, ask your doctor if you can take an over-the-counter medicine.  ??? Use a cane, crutch, walker, or another device if you need help to get around. These can help rest your joints. You also can use other things to  make life easier, such as a higher toilet seat and padded handles on kitchen utensils.  ??? Do not sit in low chairs. They can make it hard to get up.  ??? Put heat or cold on your sore joints as needed. Use whichever helps you most. You can also switch between hot and cold packs.  ? Apply heat 2 or 3 times a day for 20 to 30 minutes--using a heating pad, hot shower, or hot pack--to relieve pain and stiffness. But don't use heat on a swollen joint.  ? Put ice or a cold pack on your sore joint for 10 to 20 minutes at a time. Put a thin cloth between the ice and your skin.  When should you call for help?   Call your doctor now or seek immediate medical care if:  ?? ??? You have sudden swelling, warmth, or pain in any joint.   ?? ??? You have joint pain and a fever or rash.   ?? ??? You have such bad pain that you cannot use a joint.   Watch closely for changes in your health, and be sure to contact your doctor if:  ?? ??? You have mild joint symptoms that continue even with more than 6 weeks of care at home.   ?? ??? You have stomach pain or other problems with your medicine.   Where can you   learn more?  Go to [email or web address] and sign in to your MyChart account. Enter [phone] in the [place] box to learn more about "Arthritis: Care Instructions."     If you do not have an account, please click on the "Sign Up Now" link.  Current as of: August 03, 2020??????????????????????????????Content Version: 13.2  ?? 2006-2022 Healthwise, Incorporated.   Care instructions adapted under license by [facility]. If you have questions about a medical condition or this instruction, always ask your healthcare professional. [place] any warranty or liability for your use of this information.         Patient Education        Gastroesophageal Reflux Disease (GERD): Care Instructions  Overview     Gastroesophageal reflux disease (GERD) is the backward flow of stomach acid into the esophagus. The esophagus is  the tube that leads from your throat to your stomach. A one-way valve prevents the stomach acid from backing up into this tube. But when you have GERD, this valve does not close tightly enough. This can also cause pain and swelling in your esophagus. (This is calledesophagitis.)  If you have mild GERD symptoms including heartburn, you may be able to control the problem with antacids or over-the-counter medicine. You can also make lifestyle changes to help reduce your symptoms. These include changing yourdiet and eating habits, such as not eating late at night and losing weight.  Follow-up care is a key part of your treatment and safety. Be sure to make and go to all appointments, and call your doctor if you are having problems. It's also a good idea to know your test results and keep alist of the medicines you take.  How can you care for yourself at home?  ??? Take your medicines exactly as prescribed. Call your doctor if you think you are having a problem with your medicine.  ??? Your doctor may recommend over-the-counter medicine. For mild or occasional indigestion, antacids, such as Tums, Gaviscon, Mylanta, or Maalox, may help. Your doctor also may recommend over-the-counter acid reducers, such as famotidine (Pepcid AC), cimetidine (Tagamet HB), or omeprazole (Prilosec). Read and follow all instructions on the label. If you use these medicines often, talk with your doctor.  ??? Change your eating habits.  ? It's best to eat several small meals instead of two or three large meals.  ? After you eat, wait 2 to 3 hours before you lie down.  ? Avoid foods that make your symptoms worse. These may include chocolate, mint, alcohol, pepper, spicy foods, high-fat foods, or drinks with caffeine in them, such as tea, coffee, colas, or energy drinks. If your symptoms are worse after you eat a certain food, you may want to stop eating it to see if your symptoms get better.  ??? Do not smoke or chew tobacco. Smoking can make GERD  worse. If you need help quitting, talk to your doctor about stop-smoking programs and medicines. These can increase your chances of quitting for good.  ??? If you have GERD symptoms at night, raise the head of your bed 6 to 8 inches by putting the frame on blocks or placing a foam wedge under the head of your mattress. (Adding extra pillows does not work.)  ??? Do not wear tight clothing around your middle.  ??? Lose weight if you need to. Losing just 5 to 10 pounds can help.  When should you call for help?   Call your doctor now or seek immediate  medical care if:  ?? ??? You have new or different belly pain.   ?? ??? Your stools are black and tarlike or have streaks of blood.   Watch closely for changes in your health, and be sure to contact your doctor if:  ?? ??? Your symptoms have not improved after 2 days.   ?? ??? Food seems to catch in your throat or chest.   Where can you learn more?  Go to [email or web address] and sign in to your MyChart account. Enter [phone] in the [place] box to learn more about "Gastroesophageal Reflux Disease (GERD): Care Instructions."     If you do not have an account, please click on the "Sign Up Now" link.  Current as of: April 22, 2020??????????????????????????????Content Version: 13.2  ?? 2006-2022 Healthwise, Incorporated.   Care instructions adapted under license by [facility]. If you have questions about a medical condition or this instruction, always ask your healthcare professional. [place] any warranty or liability for your use of this information.         Patient Education        Chest Pain: Care Instructions  Your Care Instructions     There are many things that can cause chest pain. Some are not serious and will get better on their own in a few days. But some kinds of chest pain need more testing and treatment. Your doctor may have recommended a follow-up visit in the next 8 to 12 hours. If you are not getting better, you may need more  testsor treatment.  Even though your doctor has released you, you still need to watch for any problems. The doctor carefully checked you, but sometimes problems can develop later. If you have new symptoms or if your symptoms do not get better, getmedical care right away.  If you have worse or different chest pain or pressure that lasts more than 5 minutes or you passed out (lost consciousness), call 911 or seek other emergency help right away.  A medical visit is only one step in your treatment. Even if you feel better, you still need to do what your doctor recommends, such as going to all suggested follow-up appointments and taking medicines exactly as directed. Thiswill help you recover and help prevent future problems.  How can you care for yourself at home?  ??? Rest until you feel better.  ??? Take your medicine exactly as prescribed. Call your doctor if you think you are having a problem with your medicine.  ??? Do not drive after taking a prescription pain medicine.  When should you call for help?   Call 911 if:  ?? ??? You passed out (lost consciousness).   ?? ??? You have severe difficulty breathing.   ?? ??? You have symptoms of a heart attack. These may include:  ? Chest pain or pressure, or a strange feeling in your chest.  ? Sweating.  ? Shortness of breath.  ? Nausea or vomiting.  ? Pain, pressure, or a strange feeling in your back, neck, jaw, or upper belly or in one or both shoulders or arms.  ? Lightheadedness or sudden weakness.  ? A fast or irregular heartbeat.  After you call 911, the operator may tell you to chew 1 adult-strength or 2 to 4 low-dose aspirin. Wait for an ambulance. Do not try to drive yourself.   Call your doctor today if:   ?? ??? You have any trouble breathing.   ?? ??? Your chest  pain gets worse.   ?? ??? You are dizzy or lightheaded, or you feel like you may faint.   ?? ??? You are not getting better as expected.   ?? ??? You are having new or different chest pain.   Where can you learn more?  Go to  [email or web address] and sign in to your MyChart account. Enter A120 in the [place] box to learn more about "Chest Pain: Care Instructions."     If you do not have an account, please click on the "Sign Up Now" link.  Current as of: February 13, 2020??????????????????????????????Content Version: 13.2  ?? 2006-2022 Healthwise, Incorporated.   Care instructions adapted under license by [facility]. If you have questions about a medical condition or this instruction, always ask your healthcare professional. [place] any warranty or liability for your use of this information.         Patient Education        Hand Pain: Care Instructions  Your Care Instructions     Common causes of hand pain are overuse and injuries, such as might happen during sports or home repair projects. Everyday wear and tear, especially asyou get older, also can cause hand pain.  Most minor hand injuries will heal on their own, and home treatment is usually all you need to do. If you have sudden and severe pain, you may need tests andtreatment.  Follow-up care is a key part of your treatment and safety. Be sure to make and go to all appointments, and call your doctor if you are having problems. It's also a good idea to know your test results and keep alist of the medicines you take.  How can you care for yourself at home?  ??? Take pain medicines exactly as directed.  ? If the doctor gave you a prescription medicine for pain, take it as prescribed.  ? If you are not taking a prescription pain medicine, ask your doctor if you can take an over-the-counter medicine.  ??? Rest and protect your hand. Take a break from any activity that may cause pain.  ??? Put ice or a cold pack on your hand for 10 to 20 minutes at a time. Put a thin cloth between the ice and your skin.  ??? Prop up the sore hand on a pillow when you ice it or anytime you sit or lie down during the next 3 days. Try to keep it above the level of your  heart. This will help reduce swelling.  ??? If your doctor recommends a sling, splint, or elastic bandage to support your hand, wear it as directed.  When should you call for help?   Call 911 anytime you think you may need emergency care. For example, call if:  ?? ??? Your hand turns cool or pale or changes color.   Call your doctor now or seek immediate medical care if:  ?? ??? You cannot move your hand.   ?? ??? Your hand pops, moves out of its normal position, and then returns to its normal position.   ?? ??? You have signs of infection, such as:  ? Increased pain, swelling, warmth, or redness.  ? Red streaks leading from the sore area.  ? Pus draining from a place on your hand.  ? A fever.   ?? ??? Your hand feels numb or tingly.   Watch closely for changes in your health, and be sure to contact your doctor if:  ?? ??? Your  hand feels unstable when you try to use it.   ?? ??? You do not get better as expected.   ?? ??? You have any new symptoms, such as swelling.   ?? ??? Bruises from an injury to your hand last longer than 2 weeks.   Where can you learn more?  Go to [email or web address] and sign in to your MyChart account. Enter R273 in the [place] box to learn more about "Hand Pain: Care Instructions."     If you do not have an account, please click on the "Sign Up Now" link.  Current as of: February 13, 2020??????????????????????????????Content Version: 13.2  ?? 2006-2022 Healthwise, Incorporated.   Care instructions adapted under license by [facility]. If you have questions about a medical condition or this instruction, always ask your healthcare professional. [place] any warranty or liability for your use of this information.         Patient Education        Joint Pain: Care Instructions  Your Care Instructions     Many people have small aches and pains from overuse or injury to muscles and joints. Joint injuries often happen during sports or recreation, work tasks, or projects around the  home. An overuse injury can happen when you put too much stress on a joint or when you do an activity that stresses the joint over andover, such as using the computer or rowing a boat.  You can take action at home to help your muscles and joints get better. You should feel better in 1 to 2 weeks, but it can take 3 months or more to healcompletely.  Follow-up care is a key part of your treatment and safety. Be sure to make and go to all appointments, and call your doctor if you are having problems. It's also a good idea to know your test results and keep alist of the medicines you take.  How can you care for yourself at home?  ??? Do not put weight on the injured joint for at least a day or two.  ??? For the first day or two after an injury, do not take hot showers or baths, and do not use hot packs. The heat could make swelling worse.  ??? Put ice or a cold pack on the sore joint for 10 to 20 minutes at a time. Try to do this every 1 to 2 hours for the next 3 days (when you are awake) or until the swelling goes down. Put a thin cloth between the ice and your skin.  ??? Wrap the injury in an elastic bandage. Do not wrap it too tightly because this can cause more swelling.  ??? Prop up the sore joint on a pillow when you ice it or anytime you sit or lie down during the next 3 days. Try to keep it above the level of your heart. This will help reduce swelling.  ??? Take an over-the-counter pain medicine, such as acetaminophen (Tylenol), ibuprofen (Advil, Motrin), or naproxen (Aleve). Read and follow all instructions on the label.  ??? After 1 or 2 days of rest, begin moving the joint gently. While the joint is still healing, you can begin to exercise using activities that do not strain or hurt the painful joint.  When should you call for help?   Call your doctor now or seek immediate medical care if:  ?? ??? You have signs of infection, such as:  ? Increased pain, swelling, warmth, and  redness.  ? Red streaks leading from the  joint.  ? A fever.   Watch closely for changes in your health, and be sure to contact your doctor if:  ?? ??? Your movement or symptoms are not getting better after 1 to 2 weeks of home treatment.   Where can you learn more?  Go to [email or web address] and sign in to your MyChart account. Enter P205 in the [place] box to learn more about "Joint Pain: Care Instructions."     If you do not have an account, please click on the "Sign Up Now" link.  Current as of: February 13, 2020??????????????????????????????Content Version: 13.2  ?? 2006-2022 Healthwise, Incorporated.   Care instructions adapted under license by [facility]. If you have questions about a medical condition or this instruction, always ask your healthcare professional. [place] any warranty or liability for your use of this information.         Patient Education        Medication Refill: Care Instructions  Your Care Instructions     Medicines are a big part of treatment for many health problems. So it can be upsetting to run out of your medicine. It may even be dangerous to stop a medicine suddenly. The doctor may have given you enough medicine to help youuntil you can see your regular doctor.  The doctor has checked you carefully, but problems can develop later. If you notice any problems or new symptoms, get medical treatment right away.  Follow-up care is a key part of your treatment and safety. Be sure to make and go to all appointments, and call your doctor if you are having problems. It's also a good idea to know your test results and keep alist of the medicines you take.  How can you care for yourself at home?  ??? Be safe with medicines. Take your medicines exactly as prescribed.  ??? Know when you will run out of your medicine. Use a calendar to remind you to get a refill. Don't wait until you have only a few pills left.  ??? Talk with your doctor or pharmacist about your medicine. Find out what to do if you  miss a dose.  When should you call for help?   Call your doctor now or seek immediate medical care if:  ?? ??? You have problems with your medicine.   Watch closely for changes in your health, and be sure to contact your doctor ifyou have any problems.  Where can you learn more?  Go to [email or web address] and sign in to your MyChart account. Enter K326 in the [place] box to learn more about "Medication Refill: Care Instructions."     If you do not have an account, please click on the "Sign Up Now" link.  Current as of: September 26, 2019??????????????????????????????Content Version: 13.2  ?? 2006-2022 Healthwise, Incorporated.   Care instructions adapted under license by [facility]. If you have questions about a medical condition or this instruction, always ask your healthcare professional. [place] any warranty or liability for your use of this information.

## 2020-11-16 NOTE — Telephone Encounter (Signed)
Calling pt to schedule. Thank you.

## 2020-11-25 NOTE — Telephone Encounter (Addendum)
Patient referred to [facility]/Dr. Letitia Libra in 2021 and they require prior records for patient to be seen. Tried receiving records more recently from [facility] without any luck, they have not returned any of my few phone calls/voicemails or responded to any of my faxed release requests, tried receiving records on 4/5, 4/8, and 4/12. On 4/12 an [facility] operator answered and refused patient medical records without stating reason, tried notifying patient of this but both of his phone numbers listed are off/non working. Tried calling him yesterday and today to see if he would call to obtain his records with no luck.      Obtained [facility] records and they are scanned in to system. Will call Dr. Shela Commons office today to see if they will see patient now since EMG is placed in system, and will call Dr. Blanche East office to notify them that any/all prior medical records except for [facility] now available in system to see if they will accept referral, and or see if they now need a new referral order placed in system for 2022.

## 2020-11-25 NOTE — Telephone Encounter (Signed)
Spoke with Tim [name] from Dr. Shela Commons office, notified EMG results were in system, and she confirmed they will try calling patient to schedule.     Spoke with Tim [name] from Dr[facility] office, they will have patient see Dr. Charm Barges first, but patient prior [facility] records are a requirement in order for patient to be seen, advised for [place] to keep trying patient to notify him of [facility] refusing his records, he will need to obtain them for ortho referral completion/appointment. Ortho referral still pending [facility] records. Will try calling patient again soon.         [name] (Sorry for long documentation.) =)

## 2020-11-26 NOTE — Telephone Encounter (Signed)
Received [facility] disc in mail, gave disc to manager/Spencer today to send to [facility] via Inter[facility]al mail from [place] office, medical records will take records off and mail back to [place] at their earliest convenience, only way to get records off disc. Will follow up on obtaining records.

## 2020-11-26 NOTE — Telephone Encounter (Signed)
Looking forward to get the info ASAP. Thanks for letting us know.

## 2020-12-03 NOTE — Telephone Encounter (Signed)
Records obtained, handed to clinical to abstract, will call Ortho once completed.

## 2020-12-04 ENCOUNTER — Encounter

## 2020-12-04 ENCOUNTER — Inpatient Hospital Stay: Admit: 2020-12-04 | Payer: MEDICARE | Primary: Nurse Practitioner

## 2020-12-04 DIAGNOSIS — M795 Residual foreign body in soft tissue: Secondary | ICD-10-CM

## 2020-12-04 DIAGNOSIS — M25512 Pain in left shoulder: Secondary | ICD-10-CM

## 2020-12-04 NOTE — Telephone Encounter (Addendum)
Call went straight to voicemail, assuming patient phone is off. Was able to leave him a message asking him to call us back asap to confirm he had received our message stating that he is able to see. Dr. Daryll Brod and Dr. Charm Barges for his hands now that we obtained all of his prior medical records, that they will being trying to get a hold of him for an appointment if they had not already so, and to be sure to answer or call them back to schedule. Awaiting call back from patient.

## 2020-12-04 NOTE — Telephone Encounter (Signed)
Thank you someone now call him and let him know that he should be getting a call please

## 2020-12-04 NOTE — Telephone Encounter (Signed)
Noted.

## 2020-12-04 NOTE — Telephone Encounter (Signed)
Spoke with Joni Reining from Dr. Karie Fetch office this morning, notified her all prior records obtained and scanned in system, that we did receive patient [facility] records and requested to see if they would call patient to schedule. Joni Reining agreed they were now able to do so and would, confirmed she would call patient for an initial visit. All tasks completed on our end.     Joey&GIGI, FYI.

## 2020-12-10 NOTE — Telephone Encounter (Signed)
Nurse Triage [phone]    Pt called requesting a referral to GI due to rectal bleeding. He said it is a every day thing and he had a GI before but it did not work. His bleeding is not abundant or a big amount.    I advised him to come as a walking to check on his issue and give pertinent referral.

## 2020-12-28 ENCOUNTER — Encounter: Attending: Orthopaedic Surgery | Primary: Nurse Practitioner

## 2021-01-06 ENCOUNTER — Ambulatory Visit
Admit: 2021-01-06 | Discharge: 2021-01-06 | Payer: MEDICARE | Attending: Orthopaedic Surgery | Primary: Nurse Practitioner

## 2021-01-06 ENCOUNTER — Ambulatory Visit: Admit: 2021-01-06 | Discharge: 2021-01-06 | Payer: MEDICARE | Primary: Nurse Practitioner

## 2021-01-06 DIAGNOSIS — M25561 Pain in right knee: Secondary | ICD-10-CM

## 2021-01-06 DIAGNOSIS — M25562 Pain in left knee: Secondary | ICD-10-CM
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
## 2021-01-29 ENCOUNTER — Encounter: Attending: Nurse Practitioner | Primary: Nurse Practitioner

## 2021-02-01 NOTE — Telephone Encounter (Signed)
Nurse Triage [id number]    Pt's couple called saying  He had a sizure around 12:30pm of today and I informed the provider immediately (he is in office).    Going to the ER and making an appointment with neurologist were recommended and red signs where explained. She verbalized understanding saying she will call the neurologist's office.

## 2021-02-09 ENCOUNTER — Ambulatory Visit: Payer: MEDICARE | Primary: Nurse Practitioner

## 2021-02-09 ENCOUNTER — Ambulatory Visit
Admit: 2021-02-09 | Discharge: 2021-02-09 | Payer: MEDICAID | Attending: Orthopaedic Surgery | Primary: Nurse Practitioner

## 2021-02-09 ENCOUNTER — Telehealth

## 2021-02-09 DIAGNOSIS — G8929 Other chronic pain: Secondary | ICD-10-CM

## 2021-02-09 DIAGNOSIS — M25512 Pain in left shoulder: Secondary | ICD-10-CM

## 2021-02-09 MED ORDER — BETAMETHASONE SOD PHOS & ACET 6 (3-3) MG/ML IJ SUSP
6 (3-3) MG/ML | Freq: Once | INTRAMUSCULAR | Status: AC
Start: 2021-02-09 — End: 2021-02-09
  Administered 2021-02-09: 19:00:00 6 mg via INTRA_ARTICULAR

## 2021-02-09 MED ORDER — IBUPROFEN 600 MG PO TABS
600 MG | ORAL_TABLET | Freq: Four times a day (QID) | ORAL | 1 refills | Status: AC | PRN
Start: 2021-02-09 — End: ?

## 2021-02-09 NOTE — Patient Instructions (Signed)
Patient Education        Carpal Tunnel Syndrome: Care Instructions  Overview     Carpal tunnel syndrome is numbness, tingling, weakness, and pain in your hand, wrist, and sometimes forearm. It is caused by pressure on the median nerve. This nerve and several tough tissues called tendons run through a space in thewrist. This space is called the carpal tunnel.  The repeated hand motions used in work and some hobbies and sports can put pressure on the median nerve. Pregnancy can cause carpal tunnel syndrome. Several conditions, such as diabetes, arthritis, and an underactive thyroid,can also cause it.  You may be able to limit an activity or change the way you do it to reduce your symptoms. You also can take other steps to feel better. If your symptoms are mild, 1 to 2 weeks of home treatment are likely to ease your pain. Surgery isneeded only if other treatments do not work.  Follow-up care is a key part of your treatment and safety. Be sure to make and go to all appointments, and call your doctor if you are having problems. It's also a good idea to know your test results and keep alist of the medicines you take.  How can you care for yourself at home?  ??? If possible, stop or reduce the activity that causes your symptoms. If you cannot stop the activity, take frequent breaks to rest and stretch or change hand positions to do a task. Try switching hands, such as when using a computer mouse.  ??? Try to avoid bending or twisting your wrists.  ??? Ask your doctor if you can take an over-the-counter pain medicine, such as acetaminophen (Tylenol), ibuprofen (Advil, Motrin), or naproxen (Aleve). Be safe with medicines. Read and follow all instructions on the label.  ??? If your doctor prescribes corticosteroid medicine to help reduce pain and swelling, take it exactly as prescribed. Call your doctor if you think you are having a problem with your medicine.  ??? Put ice or a cold pack on your wrist for 10 to 20 minutes at a time to  ease pain. Put a thin cloth between the ice and your skin.  ??? If your doctor or your physical or occupational therapist tells you to wear a wrist splint, wear it as directed to keep your wrist in a neutral position. This also eases pressure on your median nerve.  ??? Ask your doctor whether you should have physical or occupational therapy to learn how to do tasks differently.  ??? Try a yoga class to stretch your muscles and build strength in your hands and wrists. Yoga has been shown to ease carpal tunnel symptoms.  To prevent carpal tunnel  ??? When working at a computer, keep your hands and wrists in line with your forearms. Hold your elbows close to your sides. Take a break every 10 to 15 minutes.  ??? Try these exercises:  ? Warm up: Rotate your wrist up, down, and from side to side. Repeat this 4 times. Stretch your fingers far apart, relax them, then stretch them again. Repeat 4 times. Stretch your thumb by pulling it back gently, holding it, and then releasing it. Repeat 4 times.  ? Prayer stretch: Start with your palms together in front of your chest just below your chin. Slowly lower your hands toward your waistline while keeping your hands close to your stomach and your palms together until you feel a mild to moderate stretch under your forearms. Hold for 10   to 20 seconds. Repeat 4 times.  ? Wrist flexor stretch: Hold your arm in front of you with your palm up. Bend your wrist, pointing your hand toward the floor. With your other hand, gently bend your wrist further until you feel a mild to moderate stretch in your forearm. Hold for 10 to 20 seconds. Repeat 4 times.  ? Wrist extensor stretch: Repeat the steps for the wrist flexor stretch, but begin with your extended hand palm down.  ??? Squeeze a rubber exercise ball several times a day to keep your hands and fingers strong.  ??? Avoid holding objects (such as a book) in one position for a long time. When possible, use your whole hand to grasp an object. Using just  the thumb and index finger can put stress on the wrist.  ??? Do not smoke. It can make this condition worse by reducing blood flow to the median nerve. If you need help quitting, talk to your doctor about stop-smoking programs and medicines. These can increase your chances of quitting for good.  When should you call for help?  Watch closely for changes in your health, and be sure to contact your doctor if:  ?? ??? Your pain or other problems do not get better with home care.   ?? ??? You want more information about physical or occupational therapy.   ?? ??? You have side effects of your corticosteroid medicine, such as:  ? Weight gain.  ? Mood changes.  ? Trouble sleeping.  ? Bruising easily.   ?? ??? You have any other problems with your medicine.   Where can you learn more?  Go to [email or web address] and sign in to your MyChart account. Enter R432 in the Search Health Information box to learn more about "Carpal Tunnel Syndrome: Care Instructions."     If you do not have an account, please click on the "Sign Up Now" link.  Current as of: October 21, 2020??????????????????????????????Content Version: 13.3  ?? 2006-2022 [employer].   Care instructions adapted under license by [facility]. If you have questions about a medical condition or this instruction, always ask your healthcare professional. [employer] disclaims any warranty or liability for your use of this information.       Patient Education        Carpal Tunnel Syndrome: Exercises  Introduction  Here are some examples of exercises for you to try. The exercises may be suggested for a condition or for rehabilitation. Start each exercise slowly.Ease off the exercises if you start to have pain.  You will be told when to start these exercises and which ones will work bestfor you.  Warm-up stretches  When you no longer have pain or numbness, you can do exercises to help prevent carpal tunnel syndrome from coming back. Do not do any stretch or  movement thatis uncomfortable or painful.  1. Rotate your wrist up, down, and from side to side. Repeat 4 times.  2. Stretch your fingers far apart. Relax them, and then stretch them again. Repeat 4 times.  3. Stretch your thumb by pulling it back gently, holding it, and then releasing it. Repeat 4 times.  How to do the exercises  Prayer stretch    1. Start with your palms together in front of your chest just below your chin.  2. Slowly lower your hands toward your waistline, keeping your hands close to your stomach and your palms together until you feel a mild to moderate stretch under your forearms.    3. Hold for at least 15 to 30 seconds. Repeat 2 to 4 times.  Wrist flexor stretch    1. Extend your arm in front of you with your palm up.  2. Bend your wrist, pointing your hand toward the floor.  3. With your other hand, gently bend your wrist farther until you feel a mild to moderate stretch in your forearm.  4. Hold for at least 15 to 30 seconds. Repeat 2 to 4 times.  Wrist extensor stretch    1. Repeat steps 1 through 4 of the stretch above, but begin with your extended hand palm down.  Follow-up care is a key part of your treatment and safety. Be sure to make and go to all appointments, and call your doctor if you are having problems. It's also a good idea to know your test results and keep alist of the medicines you take.  Where can you learn more?  Go to [email or web address] and sign in to your MyChart account. Enter U908 in the Search Health Information box to learn more about "Carpal Tunnel Syndrome: Exercises."     If you do not have an account, please click on the "Sign Up Now" link.  Current as of: October 21, 2020??????????????????????????????Content Version: 13.3  ?? 2006-2022 [employer].   Care instructions adapted under license by [facility]. If you have questions about a medical condition or this instruction, always ask your healthcare professional. [employer] disclaims  any warranty or liability for your use of this information.

## 2021-02-09 NOTE — Telephone Encounter (Signed)
Xray order.
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
## 2021-02-12 ENCOUNTER — Inpatient Hospital Stay: Payer: MEDICARE | Primary: Nurse Practitioner

## 2021-02-12 ENCOUNTER — Institutional Professional Consult (permissible substitution)
Admit: 2021-02-12 | Discharge: 2021-02-12 | Payer: MEDICARE | Attending: Neurological Surgery | Primary: Nurse Practitioner

## 2021-02-12 DIAGNOSIS — M542 Cervicalgia: Secondary | ICD-10-CM

## 2021-02-12 LAB — PHENYTOIN LEVEL, TOTAL: Phenytoin Lvl: 4.4 ug/mL — ABNORMAL LOW (ref 10.0–20.0)

## 2021-02-12 NOTE — Progress Notes (Signed)
Tim [name] (DOB:  [birth year]) is a 65 y.o. male,New patient, here for evaluation of the following chief complaint(s):  Neck Pain (neck pain is constant, pt has not had any PT or injections)         ASSESSMENT/PLAN:  1. Neck pain  65 year old male who presents with cervical myelopathy.  He has left arm weakness. He has gait instability and a positive Hoffmann's.  His MRI show stenosis from C2-C7.  I am recommending a posterior C2-C7 laminectomy and C2-T1 fusion.  He needs to quit smoking first.    No follow-ups on file.         Subjective   SUBJECTIVE/OBJECTIVE:  HPI  65 year old male who presents with neck pain.  The pain is rated as 9/10  He also has numbness, tingling and weakness in the left arm.  He has gait instability and balance problems.  He has had multiple falls. The neck pain is made worse with activity and better with rest.       Review of Systems   Constitutional: Negative.    HENT: Negative.    Eyes: Negative.    Respiratory: Negative.    Cardiovascular: Negative.    Gastrointestinal: Positive for diarrhea.   Endocrine: Negative.    Genitourinary: Negative.    Musculoskeletal: Negative.    Skin: Negative.    Allergic/Immunologic: Negative.    Neurological: Positive for seizures, weakness and numbness.   Hematological: Bruises/bleeds easily.   Psychiatric/Behavioral: Negative.           Objective   Physical Exam  Vitals reviewed.   Constitutional:       General: He is not in acute distress.     Appearance: Normal appearance. He is normal weight. He is not ill-appearing, toxic-appearing or diaphoretic.   HENT:      Head: Normocephalic and atraumatic.   Eyes:      General: No visual field deficit or scleral icterus.        Right eye: No discharge.         Left eye: No discharge.      Extraocular Movements: Extraocular movements intact.      Conjunctiva/sclera: Conjunctivae normal.      Pupils: Pupils are equal, round, and reactive to light.   Pulmonary:      Effort: Pulmonary effort is normal. No  respiratory distress.   Abdominal:      General: Abdomen is flat. There is no distension.   Musculoskeletal:         General: No swelling, tenderness, deformity or signs of injury. Normal range of motion.      Right lower leg: No edema.      Left lower leg: No edema.   Skin:     General: Skin is warm and dry.      Capillary Refill: Capillary refill takes less than 2 seconds.      Coloration: Skin is not jaundiced or pale.      Findings: No bruising, erythema, lesion or rash.   Neurological:      Mental Status: He is alert and oriented to person, place, and time. Mental status is at baseline.      GCS: GCS eye subscore is 4. GCS verbal subscore is 5. GCS motor subscore is 6.      Cranial Nerves: Cranial nerves are intact. No cranial nerve deficit, dysarthria or facial asymmetry.      Sensory: Sensation is intact. No sensory deficit.  Motor: Weakness and pronator drift present. No tremor, atrophy, abnormal muscle tone or seizure activity.      Coordination: Coordination abnormal.      Gait: Gait abnormal.      Deep Tendon Reflexes: Reflexes abnormal.      Reflex Scores:       Tricep reflexes are 3+ on the right side and 3+ on the left side.       Bicep reflexes are 3+ on the right side and 3+ on the left side.       Brachioradialis reflexes are 3+ on the right side and 3+ on the left side.       Patellar reflexes are 3+ on the right side and 3+ on the left side.       Achilles reflexes are 3+ on the right side and 3+ on the left side.     Comments: Positive Hoffmann's bilaterally   Psychiatric:         Mood and Affect: Mood normal.         Behavior: Behavior normal.         Thought Content: Thought content normal.         Judgment: Judgment normal.            On this date 02/12/2021 I have spent 45 minutes reviewing previous notes, test results and face to face with the patient discussing the diagnosis and importance of compliance with the treatment plan as well as documenting on the day of the visit.      An  electronic signature was used to authenticate this note.    --Halina Andreas, MD

## 2021-03-05 ENCOUNTER — Encounter

## 2021-03-05 ENCOUNTER — Ambulatory Visit: Admit: 2021-03-05 | Discharge: 2021-03-05 | Payer: MEDICARE | Attending: Family | Primary: Nurse Practitioner

## 2021-03-05 ENCOUNTER — Encounter: Attending: Nurse Practitioner | Primary: Nurse Practitioner

## 2021-03-05 DIAGNOSIS — R609 Edema, unspecified: Secondary | ICD-10-CM

## 2021-03-05 MED ORDER — FUROSEMIDE 20 MG PO TABS
20 MG | ORAL_TABLET | Freq: Every day | ORAL | 0 refills | Status: DC
Start: 2021-03-05 — End: 2021-05-31

## 2021-03-05 MED ORDER — FUROSEMIDE 20 MG PO TABS
20 MG | ORAL_TABLET | Freq: Every day | ORAL | 3 refills | Status: DC
Start: 2021-03-05 — End: 2021-03-05

## 2021-03-05 NOTE — Telephone Encounter (Signed)
Requesting 90 day
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
## 2021-03-05 NOTE — Telephone Encounter (Signed)
E [id number]  Haley from nurse triage transferred me a call with pt using red flag word headache.     Pts roommate charlene called and states pt has been experiencing extreme headache and consistent diarrhea for the last couple days. Denies any other symptoms. Advised the ER or walk in clinic to be evaluated by provider. Pts roommate verbalized understanding and states she will try to bring him through walk in clinic to be seen.

## 2021-03-23 ENCOUNTER — Encounter: Attending: Orthopaedic Surgery | Primary: Nurse Practitioner

## 2021-03-25 ENCOUNTER — Encounter: Payer: MEDICAID | Attending: Orthopaedic Surgery | Primary: Nurse Practitioner

## 2021-04-26 ENCOUNTER — Encounter: Attending: Nurse Practitioner | Primary: Nurse Practitioner

## 2021-04-27 NOTE — Telephone Encounter (Signed)
-----   Message from [place] Pugh sent at 04/26/2021 10:02 AM EDT -----  Subject: Message to Provider    QUESTIONS  Information for Provider? Patient is requesting to call back to confirm   medical records and fax has have been receive by Dr. Francella Solian. He is   requesting a call back to confirm.  ---------------------------------------------------------------------------  --------------  Cleotis Lema INFO  [phone]; OK to leave message on voicemail  ---------------------------------------------------------------------------  --------------  SCRIPT ANSWERS  Relationship to Patient? Self

## 2021-04-27 NOTE — Telephone Encounter (Signed)
Called pt. Left VM to call back.

## 2021-04-28 NOTE — Telephone Encounter (Signed)
Called pt. Left VM to call back.

## 2021-04-29 ENCOUNTER — Telehealth

## 2021-04-29 NOTE — Telephone Encounter (Signed)
Pt calling looking for referrals to Cardiology, Orthopedics and Infectious Disease I advised it would be best to be seen to discuss these referrals he has a scheduled appt on 05/31/21 that was the earliest appt available. He said if there are cancellations he'd like to get in earlier or if you can squeeze him in somewhere.

## 2021-04-29 NOTE — Telephone Encounter (Signed)
Spoke with pt gave him the phone numbers for the 3 referrals to neuro, ortho and cardio - he is still needing a referral to infectious disease he was recently in the ED in [place] and labs showed MRSA

## 2021-05-03 NOTE — Telephone Encounter (Signed)
Called pt. Left VM to call back.

## 2021-05-05 NOTE — Telephone Encounter (Signed)
Looked in patient's chart under encounters, last time in ED was July 10th ,looked through [facility] documents (Labs/Notes, etc..) did not see anything about MRSA anywhere to send with referral so, no supporting records to send to Infectious Disease that I can see. Joey please advise.    (Also, no Infectious Disease provider listed for me for referral, please advise.

## 2021-05-05 NOTE — Telephone Encounter (Signed)
Left voicemail for patient to call back at his earliest convenience.

## 2021-05-05 NOTE — Telephone Encounter (Signed)
Infectious disease order pended, it is empty because I have tried to contact the patient multiple times to see location without success, I do not know any providers ether.    This patient is really hard to reach. All the referrals have been sent in a correct form and manner but depends on the patients to make the appt for the follow ups.

## 2021-05-10 NOTE — Telephone Encounter (Signed)
Patient has an appointment on Oct 17th, wishes to just keep that appointment for possible infectious diease referral and other follow up related issues.    Joey, fyi.

## 2021-05-11 NOTE — Telephone Encounter (Signed)
Ok.

## 2021-05-31 ENCOUNTER — Ambulatory Visit
Admit: 2021-05-31 | Discharge: 2021-05-31 | Payer: MEDICAID | Attending: Nurse Practitioner | Primary: Nurse Practitioner

## 2021-05-31 DIAGNOSIS — R609 Edema, unspecified: Secondary | ICD-10-CM

## 2021-05-31 MED ORDER — POTASSIUM CHLORIDE CRYS ER 20 MEQ PO TBCR
20 MEQ | ORAL_TABLET | Freq: Every day | ORAL | 5 refills | Status: DC
Start: 2021-05-31 — End: 2021-06-01

## 2021-05-31 MED ORDER — BUMETANIDE 0.5 MG PO TABS
0.5 MG | ORAL_TABLET | Freq: Every day | ORAL | 3 refills | Status: DC
Start: 2021-05-31 — End: 2021-06-10

## 2021-05-31 MED ORDER — AMOXICILLIN 875 MG PO TABS
875 MG | ORAL_TABLET | Freq: Two times a day (BID) | ORAL | 0 refills | Status: AC
Start: 2021-05-31 — End: 2021-06-10

## 2021-05-31 MED ORDER — FUROSEMIDE 20 MG PO TABS
20 MG | ORAL_TABLET | Freq: Every evening | ORAL | 3 refills | Status: AC
Start: 2021-05-31 — End: ?

## 2021-05-31 NOTE — Patient Instructions (Signed)
What is lung cancer screening?  Lung cancer screening is a way in which doctors check the lungs for early signs of cancer in people who have no symptoms of lung cancer.  A low-dose CT scan uses much less radiation than a normal CT scan and shows a more detailed image of the lungs than a standard X-ray.  The goal of lung cancer screening is to find cancer early, before it has a chance to grow, spread, or cause problems.  One large study found that smokers who were screened with low-dose CT scans were less likely to die of lung cancer than those who were screened with standard X-ray.    Below is a summary of the things you need to know regarding screening for lung cancer with low-dose computed tomography (LDCT).  This is a screening program that involves routine annual screening with LDCT studies of the lung.  The LDCTs are done using low-dose radiation that is not thought to increase your cancer risk.  If you have other serious medical conditions (other cancers, congestive heart failure) that limit your life expectancy to less than 10 years, you should not undergo lung cancer screening with LDCT.  The chance is 20%-60% that the LDCT result will show abnormalities.  This would require additional testing which could include repeat imaging or even invasive procedures.  Most (about 95%) of "abnormal" LDCT results are false in the sense that no lung cancer is ultimately found.  Additionally, some (about 10%) of the cancers found would not affect your life expectancy, even if undetected and untreated.  If you are still smoking, the single most important thing that you can do to reduce your risk of dying of lung cancer is to quit.    For this screening to be covered by Medicare and most other insurers, strict criteria must be met.  If you do not meet these criteria, but still wish to undergo LDCT testing, you will be required to sign a waiver indicating your willingness to pay for the scan.
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
## 2021-06-01 MED ORDER — POTASSIUM CHLORIDE CRYS ER 20 MEQ PO TBCR
20 MEQ | ORAL_TABLET | Freq: Every day | ORAL | 1 refills | Status: AC
Start: 2021-06-01 — End: ?

## 2021-06-01 NOTE — Telephone Encounter (Signed)
Patient requesting three months refill at a time.

## 2021-06-02 NOTE — Telephone Encounter (Signed)
Faxed DME orders for hospital bed, walker and wheelchair with Provider's face to face visit to [facility] [phone] on 06/01/21.    Institution usually manage their own PA and they let us know if they need anything else from [place].

## 2021-06-08 ENCOUNTER — Telehealth: Payer: MEDICARE

## 2021-06-08 NOTE — Telephone Encounter (Signed)
Patient called requesting bed pads and depends (undergarment for urine or fecal incontinence), I pended the order for you to check and sign. For the Depends I think you will have to sign a written order because I could not find it in the system.    When done, let me know to fax it. Thanks.

## 2021-06-10 MED ORDER — BUMETANIDE 0.5 MG PO TABS
0.5 MG | ORAL_TABLET | Freq: Every day | ORAL | 1 refills | Status: AC
Start: 2021-06-10 — End: ?

## 2021-06-10 NOTE — Telephone Encounter (Signed)
Patient requesting 90 days refill.

## 2021-06-10 NOTE — Telephone Encounter (Signed)
Formatting of this note might be different from the original.  Patient requesting 90 days refill.  Electronically signed by Galvin Proffer Conversion Provider at 07/19/2021  2:38 PM EST

## 2021-06-21 ENCOUNTER — Encounter: Payer: MEDICARE | Attending: Physician Assistant | Primary: Nurse Practitioner

## 2021-06-22 ENCOUNTER — Encounter: Payer: MEDICARE | Attending: Orthopaedic Surgery | Primary: Nurse Practitioner

## 2021-06-22 ENCOUNTER — Encounter

## 2021-06-22 NOTE — Other (Signed)
REVIEWED   Good news please let the patient know  NO NEW ORDERS   PLEASE CALL PATIENT WITH RESULTS

## 2021-06-25 LAB — CBC WITH AUTO DIFFERENTIAL
Basophils %: 0.4 %
Basophils Absolute: 0 /??L
Eosinophils %: 1.6 %
Eosinophils Absolute: 0.1 /??L
Hematocrit: 41.2 % (ref 41–53)
Hemoglobin: 13.7 g/dL (ref 13.5–17.5)
Lymphocytes %: 18.1 %
Lymphocytes Absolute: 0.9 /??L
MCH: 31.9 pg
MCHC: 33.3 g/dL
MCV: 96 fL
MPV: 7.5 fL
Monocytes %: 8.3 %
Monocytes Absolute: 0.4 /??L
Neutrophils %: 71.6 %
Neutrophils Absolute: 3.6 /??L
Platelets: 227 K/??L
RBC: 4.29 10^6/??L
RDW: 14.4 %
WBC: 5 10^3/mL

## 2021-06-25 LAB — LIPID PANEL
Chol/HDL Ratio: 3.5
Cholesterol, Total: 115 mg/dL
HDL: 33 mg/dL — AB (ref 35–70)
LDL Calculated: 57 mg/dL (ref 0–160)
Triglycerides: 126 mg/dL
VLDL: 25 mg/dL

## 2021-06-25 LAB — COMPREHENSIVE METABOLIC PANEL, FASTING
ALT: 38 U/L
AST: 29 U/L
Albumin: 3 — ABNORMAL LOW
Alkaline Phosphatase: 211 U/L — ABNORMAL HIGH
BUN: 26 mg/dL — ABNORMAL HIGH
CO2: 21 mmol/L
Calcium: 8.1 mg/dL — ABNORMAL LOW
Chloride: 111 mmol/L — ABNORMAL HIGH
Creatinine: 0.8
Glucose, Fasting: 100 mg/dL
Potassium: 4.1 mmol/L
Sodium: 139 mmol/L
Total Bilirubin: 0.2 mg/dL (ref 0.1–1.4)
Total Protein: 6.7 g/dL (ref 6.4–8.2)

## 2021-06-25 LAB — URINALYSIS
Bilirubin, Urine: NEGATIVE
Blood, Urine: NEGATIVE
Glucose, Ur: NEGATIVE
Ketones, Urine: NEGATIVE
Leukocyte Esterase, Urine: NEGATIVE
Nitrite, Urine: NEGATIVE
Protein, UA: NEGATIVE
Specific Gravity, Urine: 1.025
Urobilinogen, Urine: NORMAL
pH, UA: 5.5 (ref 4.5–8.0)

## 2021-06-25 LAB — PSA SCREENING: PSA: 1.16 ng/mL

## 2021-06-25 LAB — TSH: TSH: 1.77 u[IU]/mL

## 2021-06-29 ENCOUNTER — Encounter: Payer: MEDICARE | Attending: Physician Assistant | Primary: Nurse Practitioner

## 2021-07-01 NOTE — Telephone Encounter (Signed)
Error

## 2021-07-02 ENCOUNTER — Ambulatory Visit
Admit: 2021-07-02 | Discharge: 2021-07-02 | Payer: MEDICARE | Attending: Nurse Practitioner | Primary: Nurse Practitioner

## 2021-07-02 ENCOUNTER — Ambulatory Visit
Admit: 2021-07-02 | Discharge: 2021-07-02 | Payer: MEDICAID | Attending: Nurse Practitioner | Primary: Nurse Practitioner

## 2021-07-02 DIAGNOSIS — L309 Dermatitis, unspecified: Secondary | ICD-10-CM

## 2021-07-02 DIAGNOSIS — Z0000 Encounter for general adult medical examination without abnormal findings: Secondary | ICD-10-CM

## 2021-07-02 MED ORDER — AMOXICILLIN 875 MG PO TABS
875 MG | ORAL_TABLET | Freq: Two times a day (BID) | ORAL | 0 refills | Status: AC
Start: 2021-07-02 — End: 2021-07-12

## 2021-07-02 MED ORDER — NICOTINE 14 MG/24HR TD PT24
14 MG/24HR | MEDICATED_PATCH | Freq: Every day | TRANSDERMAL | 5 refills | Status: AC
Start: 2021-07-02 — End: 2021-12-31

## 2021-07-02 MED ORDER — NICOTINE 7 MG/24HR TD PT24
724 MG/24HR | MEDICATED_PATCH | Freq: Every day | TRANSDERMAL | 0 refills | Status: DC
Start: 2021-07-02 — End: 2021-11-29

## 2021-07-02 MED ORDER — NICOTINE 21 MG/24HR TD PT24
2124 MG/24HR | MEDICATED_PATCH | Freq: Every day | TRANSDERMAL | 0 refills | Status: AC
Start: 2021-07-02 — End: 2021-08-13

## 2021-07-02 NOTE — Patient Instructions (Signed)
Quitting Tobacco: Care Instructions  Quitting tobacco is much harder than simply changing a habit. Nicotine cravings make it hard to quit, but you can do it. Your doctor will help you set up the plan that best meets your needs.  You will miss the nicotine at first. You may feel short-tempered and grumpy. You may have trouble sleeping or thinking clearly. The urge to use tobacco may continue for a time.  Combining tools can raise your chances of success. You can use medicine along with counseling. And you can join a quit-tobacco program, such as the American [employer] Association's Freedom from Smoking program.    Get support.  Reach out to family and friends, and find a counselor to help you quit. Join a support group, such as Nicotine Anonymous. Go to www.smokefree.gov to sign up for text messaging support.     Talk to your doctor or pharmacist about medicines that can help you quit.  Medicines can help with cravings and withdrawal symptoms. There are several over-the-counter choices, such as nicotine patches, gum, and lozenges.     After you quit, do not use tobacco again, not even once.  Get rid of all tobacco products and anything that reminds you of tobacco, such as ashtrays.     Avoid things that make you reach for tobacco.  Change your daily routine. Take a different route to work, or eat a meal in a different place.     Try to cut down on stress.  Find ways to calm yourself, such as taking a hot bath or doing deep breathing exercises.     Eat a healthy diet, and get regular exercise.  Having healthy habits may help you quit using tobacco.     Don't give up on quitting if you use tobacco again.  Most people quit and restart a few times before they quit for good.   Follow-up care is a key part of your treatment and safety. Be sure to make and go to all appointments, and call your doctor if you are having problems. It's also a good idea to know your test results and keep a list of the medicines you take.  Where  can you learn more?  Go to [email or web address] and sign in to your MyChart account. Enter [phone] in the [place] box to learn more about "Quitting Tobacco: Care Instructions."     If you do not have an account, please click on the "Sign Up Now" link.  Current as of: June 22, 2020??????????????????????????????Content Version: 13.4  ?? 2006-2022 [employer].   Care instructions adapted under license by [facility]. If you have questions about a medical condition or this instruction, always ask your healthcare professional. [place] any warranty or liability for your use of this information.           Learning About Benefits From Quitting Smoking  Why is it important to quit smoking?     If you're thinking about quitting smoking, you may have a few reasons to be smoke-free. Your health may be one of them.  When you quit smoking, you lower your risk for many serious health problems, such as cancer, lung disease, heart attack, stroke, blood vessel disease, and blindness from macular degeneration.  When you're smoke-free, you get sick less often, and you heal faster. You are less likely to get colds, flu, bronchitis, and pneumonia.  As a nonsmoker, you may find that your mood is better and you are less stressed.  When and how will you feel healthier?  Quitting has real health benefits that start from day 1 of being smoke-free. And the longer you stay smoke-free, the healthier you get and the better you feel.  The first hours  After just 20 minutes, your blood pressure and heart rate go down. That means there's less stress on your heart and blood vessels.  Within 12 hours, the level of carbon monoxide in your blood drops back to normal. That makes room for more oxygen. With more oxygen in your body, you may notice that you have more energy than when you smoked.  After 2 weeks  Your lungs start to work better.  Your risk of heart attack starts to drop.  After 1  month  When your lungs are clear, you cough less and breathe deeper, so it's easier to be active.  Your sense of taste and smell return. That means you can enjoy food more than you have since you started smoking.  Over the years  Over the years, your risks of heart disease, heart attack, and stroke are lower.  After 10 years, your risk of dying from lung cancer is cut by about half. And your risk for many other types of cancer is lower too.  How would quitting help others in your life?  When you quit smoking, you improve the health of everyone who now breathes in your smoke.  Their heart, lung, and cancer risks drop, much like yours.  They are sick less. For babies and small children, living smoke-free means they're less likely to have ear infections, pneumonia, and bronchitis.  If you're a woman who is or will be pregnant someday, quitting smoking means a healthier newborn.  Children who are close to you are less likely to become adult smokers.  Where can you learn more?  Go to [email or web address] and sign in to your MyChart account. Enter O319 in the Search Health Information box to learn more about "Learning About Benefits From Quitting Smoking."     If you do not have an account, please click on the "Sign Up Now" link.  Current as of: June 11, 2020??????????????????????????????Content Version: 13.4  ?? 2006-2022 [employer].   Care instructions adapted under license by [facility]. If you have questions about a medical condition or this instruction, always ask your healthcare professional. [employer] disclaims any warranty or liability for your use of this information.      Personalized Preventive Plan for MANG HAZELRIGG - 07/02/2021  Medicare offers a range of preventive health benefits. Some of the tests and screenings are paid in full while other may be subject to a deductible, co-insurance, and/or copay.    Some of these benefits include a comprehensive review of your  medical history including lifestyle, illnesses that may run in your family, and various assessments and screenings as appropriate.    After reviewing your medical record and screening and assessments performed today your provider may have ordered immunizations, labs, imaging, and/or referrals for you.  A list of these orders (if applicable) as well as your Preventive Care list are included within your After Visit Summary for your review.    Other Preventive Recommendations:    A preventive eye exam performed by an eye specialist is recommended every 1-2 years to screen for glaucoma; cataracts, macular degeneration, and other eye disorders.  A preventive dental visit is recommended every 6 months.  Try to get at least 150 minutes of exercise per week or 10,000  steps per day on a pedometer .  Order or download the FREE "Exercise & Physical Activity: Your Everyday Guide" from [employer]. Call [phone] or search [employer] online.  You need 1200-1500 mg of calcium and 1000-2000 IU of vitamin D per day. It is possible to meet your calcium requirement with diet alone, but a vitamin D supplement is usually necessary to meet this goal.  When exposed to the sun, use a sunscreen that protects against both UVA and UVB radiation with an SPF of 30 or greater. Reapply every 2 to 3 hours or after sweating, drying off with a towel, or swimming.  Always wear a seat belt when traveling in a car. Always wear a helmet when riding a bicycle or motorcycle.
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
## 2021-07-02 NOTE — Progress Notes (Signed)
Medicare Annual Wellness Visit    Tim [name] is here for Medicare AWV (Doing good overall)    Assessment & Plan   Initial Medicare annual wellness visit  Nonintractable epilepsy without status epilepticus, unspecified epilepsy type (HCC)  Seizures (HCC)  Idiopathic chronic pancreatitis (HCC)  Personal history of tobacco use    Recommendations for Preventive Services Due: see orders and patient instructions/AVS.  Recommended screening schedule for the next 5-10 years is provided to the patient in written form: see Patient Instructions/AVS.     Return for Medicare Annual Wellness Visit in 1 year.     Subjective   The following acute and/or chronic problems were also addressed today:  Elevated LFTs    Patient's complete Health Risk Assessment and screening values have been reviewed and are found in Flowsheets. The following problems were reviewed today and where indicated follow up appointments were made and/or referrals ordered.    Positive Risk Factor Screenings with Interventions:    Fall Risk:  Do you feel unsteady or are you worried about falling? : (!) yes  2 or more falls in past year?: no  Fall with injury in past year?: no   Fall Risk Interventions:    Home safety tips provided      Tobacco Use:  Tobacco Use: High Risk    Smoking Tobacco Use: Every Day    Smokeless Tobacco Use: Never    Passive Exposure: Not on file     E-cigarette/Vaping       Questions Responses    E-cigarette/Vaping Use Never User    Start Date     Passive Exposure     Quit Date     Counseling Given     Comments           Substance Use - Tobacco Interventions:  tobacco cessation tips and resources provided         General Health and ACP:  General  In general, how would you say your health is?: Good  In the past 7 days, have you experienced any of the following: New or Increased Pain, New or Increased Fatigue, Loneliness, Social Isolation, Stress or Anger?: (!) Yes  Select all that apply: (!) New or Increased Pain  Do you get the social  and emotional support that you need?: Yes  Do you have a Living Will?: (!) No    Advance Directives       Power of Attorney Living Will ACP-Advance Directive ACP-Power of Attorney    Not on File Not on File Not on File Not on File        General Health Risk Interventions:  No Living Will: Advance Care Planning addressed with patient today    Health Habits/Nutrition:  Physical Activity: Inactive    Days of Exercise per Week: 0 days    Minutes of Exercise per Session: 0 min     Have you lost any weight without trying in the past 3 months?: No  Body mass index: 20.45  Have you seen the dentist within the past year?: Yes  Health Habits/Nutrition Interventions:  Inadequate physical activity:  educational materials provided to promote increased physical activity     Safety:  Do you have working smoke detectors?: Yes  Do you have any tripping hazards - loose or unsecured carpets or rugs?: No  Do you have any tripping hazards - clutter in doorways, halls, or stairs?: No  Do you have either shower bars, grab bars, non-slip mats or non-slip surfaces  in your shower or bathtub?: (!) No  Do all of your stairways have a railing or banister?: Not Applicable  Do you always fasten your seatbelt when you are in a car?: Yes  Safety Interventions:  Home safety tips provided           Objective   Vitals:    07/02/21 1438   BP: 100/70   Site: Left Upper Arm   Position: Sitting   Cuff Size: Medium Adult   Pulse: 95   Resp: 17   Temp: 98.3 ??F (36.8 ??C)   TempSrc: Temporal   SpO2: 98%   Weight: 155 lb (70.3 kg)   Height: 6\' 1"  (1.854 m)      Body mass index is 20.45 kg/m??.      General Appearance: alert and oriented to person, place and time, well developed and well- nourished, in no acute distress  Skin: warm and dry, no rash or erythema  Head: normocephalic and atraumatic  Eyes: pupils equal, round, and reactive to light, extraocular eye movements intact, conjunctivae normal  ENT: tympanic membrane, external ear and ear canal normal  bilaterally, nose without deformity, nasal mucosa and turbinates normal without polyps  Neck: supple and non-tender without mass, no thyromegaly or thyroid nodules, no cervical lymphadenopathy  Pulmonary/Chest: clear to auscultation bilaterally- no wheezes, rales or rhonchi, normal air movement, no respiratory distress  Cardiovascular: normal rate, regular rhythm, normal S1 and S2, no murmurs, rubs, clicks, or gallops, distal pulses intact, no carotid bruits  Abdomen: soft, non-tender, non-distended, normal bowel sounds, no masses or organomegaly  Neurologic: reflexes normal and symmetric, no cranial nerve deficit, gait, coordination and speech normal       Allergies   Allergen Reactions    Latex Hives    Tramadol Other (See Comments)     seizures  Other reaction(s): Other    Morphine     Iodine Hives    Red Dye Hives     Prior to Visit Medications    Medication Sig Taking? Authorizing Provider   DILANTIN 30 MG ER capsule Take 1 capsule by mouth at bedtime Yes Historical Provider, MD   bumetanide (BUMEX) 0.5 MG tablet Take 1 tablet by mouth daily (with breakfast) Yes , APRN - CNP   CREON 36000-114000 units CPEP delayed release capsule Take 1 capsule by mouth in the morning, at noon, and at bedtime Yes Historical Provider, MD   furosemide (LASIX) 20 MG tablet Take 1 tablet by mouth at bedtime Yes Candida Peeling, APRN - CNP   omeprazole (PRILOSEC) 40 MG delayed release capsule  Yes Historical Provider, MD   ibuprofen (ADVIL;MOTRIN) 600 MG tablet Take 1 tablet by mouth 4 times daily as needed for Pain Yes Candida Peeling, MD   acetaminophen (TYLENOL) 500 MG tablet Take 1 tablet by mouth 4 times daily as needed for Pain Yes [place], APRN - CNP   phenytoin (DILANTIN) 100 MG ER capsule Take 100 mg by mouth 3 times daily  Yes Historical Provider, MD   potassium chloride (KLOR-CON M) 20 MEQ extended release tablet TAKE 1 TABLET BY MOUTH DAILY  Candida Peeling, APRN - CNP       CareTeam (Including  outside providers/suppliers regularly involved in providing care):   Patient Care Team:  Candida Peeling, APRN - CNP as PCP - General (Nurse Practitioner)  Candida Peeling, APRN - CNP as PCP - [facility] Empaneled Provider     Reviewed and updated this visit:  Tobacco   Allergies  Meds   Problems   Med Hx   Surg Hx   Soc Hx   Fam Hx               Tobacco Cessation Counseling: Patient advised about behavior change, including information about personal health harms, usage of appropriate cessation measures and benefits of cessation.  Time spent (minutes): 12         Electronically signed by Candida Peeling, APRN - CNP on 07/02/2021 at 3:10 PM

## 2021-07-05 ENCOUNTER — Encounter

## 2021-07-06 ENCOUNTER — Telehealth: Payer: MEDICARE

## 2021-07-06 NOTE — Telephone Encounter (Signed)
Hi Tim [name], the insurance is asking for another ICD-10 code for the underpads, not just the urinary incontinency but the reason of why (like overreactive bladder).    The order that start with PR (which is a charge for the patient) is incorrect, but I do no see another in the system, so I think we will be using one.    Pending it again for you to change that diagnosis. Thanks.

## 2021-07-07 NOTE — Addendum Note (Signed)
Addended by: Jearld Adjutant on: 07/07/2021 11:47 AM     Modules accepted: Orders

## 2021-07-12 ENCOUNTER — Encounter: Payer: MEDICARE | Attending: Physician Assistant | Primary: Nurse Practitioner

## 2021-07-12 NOTE — Telephone Encounter (Signed)
Patient was in [facility] for a seizure. He would like to make a hospital f/u. No available appointments anytime soon. Please advise for scheduling.

## 2021-07-13 ENCOUNTER — Ambulatory Visit
Admit: 2021-07-13 | Discharge: 2021-07-13 | Payer: MEDICARE | Attending: Physician Assistant | Primary: Nurse Practitioner

## 2021-07-13 DIAGNOSIS — R569 Unspecified convulsions: Secondary | ICD-10-CM

## 2021-07-13 DIAGNOSIS — M4844XA Fatigue fracture of vertebra, thoracic region, initial encounter for fracture: Secondary | ICD-10-CM

## 2021-07-13 NOTE — Telephone Encounter (Signed)
Patient advised of express care for ED follow up after speaking with Nurse/Sarah from [place] location, pcp not here the rest of the week.

## 2021-07-13 NOTE — Telephone Encounter (Signed)
E [id number]    Terri transferred me the patient stating he was having ankle pain.     Pt states he had a seizure on Sunday and he was seen in [place]. He states now he cant walk on his left ankle, he thinks he injured it when he had seizure. His ankle is very painful and he is not taking any medication for the pain. Pt has appointment today with neurology at 11 and per [facility] I advised pt to keep that appointment and see if there would be anything they can do for him if they are unable to I advised pt he should be seen through our walk in clinic for his left ankle, we would be able to xray it and see if anything was broken. Also per Jonny Ruiz I advised patient that ibuprofen, motrin would help with the inflammation. Patient verbalized understanding and states he will start with neurologist.

## 2021-07-13 NOTE — Telephone Encounter (Signed)
Returned call to schedule. No answer. Left message to contact the office to schedule hospital follow up.

## 2021-07-13 NOTE — Progress Notes (Signed)
Problem Focused Office Visit     Subjective: Tim [name] is a 65 y.o.  male who has a past medical history of  epilepsy, pancreatitis, and GERD who is know to our practice.  Patient was seen by Dr. Priscella Mann on 02/12/2021 for neck pain and cervical myelopathy and a C2-T1 fusion was offered once patient is nicotine free for 6 weeks.     Patient presents today for evaluation of possible fractures. Patient states he had a seizure on Sunday and fell. He is currently being followed by a Neurologist in [place] and his seizure medication is being adjusted. Patient has since had mid and low back pain and his family doctor sent him here to be evaluated for fractures. Patient describes his pain as a sharp pain. Denies any radiation of this pain his legs. No new numbness or weakness, but states he does have baseline numbness from his bilateral knee replacement back in 2017. He denies any bowel or bladder incontinence.  He is a current smoker, smoking 1/2 ppd, he states he is trying to quit and is on the nicotine patch. He denies any blood thinner usage.      Physical Exam  HENT:      Head: Normocephalic.   Eyes:      Pupils: Pupils are equal, round, and reactive to light.   Cardiovascular:      Rate and Rhythm: Normal rate.   Pulmonary:      Effort: Pulmonary effort is normal.   Abdominal:      General: There is no distension.   Musculoskeletal:         General: Normal range of motion.      Cervical back: Normal range of motion.   Skin:     General: Skin is warm and dry.   Neurological:      Mental Status: He is alert.      Comments: A&Ox3  In Wheelchair  CN3-12 intact  LLE 4/5 throughout otherwise Motor Strength full   Sensation intact to light touch   Mild tenderness to palpation of thoracic and lumbar spine   Psychiatric:         Thought Content: Thought content normal.         Assessment: This is a 65 y.o.  male presenting for a office follow-up for evaluation of fractures. Patient with mid to low back pain aggravated by  seizure on Sunday. No imaging to review.      Plan:  -Pain control and expectations discussed  -Obtain MRI Thoracic and Lumbar spine to evaluate for acute fractures  -Smoking cessation   -OARRS report reviewed   -Return to/call Neurosurgery clinic after completion of imaging to discuss results and further treatment plan  -Call/return sooner if symptoms worsen or new issues arise in the interim     Electronically signed by Carmela Rima, PA-C on 07/13/2021 at 3:43 PM

## 2021-07-19 ENCOUNTER — Encounter: Payer: MEDICARE | Attending: Orthopaedic Surgery | Primary: Nurse Practitioner

## 2021-07-22 ENCOUNTER — Encounter

## 2021-07-22 NOTE — Telephone Encounter (Addendum)
[facility]  MRI WO CONTRAST THORACIC AND LUMBAR  Case# [id number]  Pending review  Additional notes sent  Xray thoracic and lumbar ordered    Pt requesting [facility]. Spoke with Kendrick, informed her of auth status and xray orders.   Phone# [phone]  Fax# [phone]      Attempted to contact patient to inform him of the Xray orders and the status of the MRI orders, unable to reach him at either home or mobile number in chart.

## 2021-08-02 NOTE — Telephone Encounter (Signed)
[facility]  MRI WO CONTRAST THORACIC AND LUMBAR  Case# [id number]  DENIED    Due to needing xrays completed that show changes that show a need for further imaging.    Spoke with [employer], informed her of auth status and xray orders where never completed that we are aware of.  Phone# [phone]  Fax# [phone]

## 2021-08-03 NOTE — Telephone Encounter (Signed)
Attempted to reach patient regarding the MRI denial, no answer, left voicemail. Informed the Xrays needed to be completed and they would need to bring the disc into the office/schedule an appt to review images.     [facility], informed them patient was unreachable. Denial for the MRI's was faxed to [place] for records.

## 2021-08-10 NOTE — Telephone Encounter (Signed)
Patients emergency contact called back with a fax number. Fax is [phone]

## 2021-08-10 NOTE — Telephone Encounter (Signed)
Charlene called wanting Dr to send patients medical condition of epilepsy sent to Dr Launa Grill, she couldn't remember fax number will call back to give that info. Its so patient can receive is medical marijuana card.

## 2021-08-10 NOTE — Telephone Encounter (Signed)
Please see notes below

## 2021-08-11 ENCOUNTER — Telehealth

## 2021-08-11 NOTE — Telephone Encounter (Signed)
Re-faxing. Thanks.

## 2021-08-11 NOTE — Telephone Encounter (Signed)
[facility] need ICD-10 code for underlying reason for urinary incontinence for insurance to pay the incontinence items.    Please let me know to fax the paperwork. Thanks.

## 2021-08-11 NOTE — Telephone Encounter (Signed)
Called and left message on patients machine that patient will need to stop up to office to sign release of records for [place] to fax to Dr Joan Flores RE: medical marijuana card , as per patient requested.

## 2021-08-12 ENCOUNTER — Encounter: Payer: MEDICAID | Attending: Orthopaedic Surgery | Primary: Nurse Practitioner

## 2021-08-13 NOTE — Telephone Encounter (Signed)
Patient got teeth pull out, so he is wondering if you can send supplements, like ensure.

## 2021-09-20 ENCOUNTER — Inpatient Hospital Stay: Payer: MEDICARE | Primary: Nurse Practitioner

## 2021-09-20 ENCOUNTER — Inpatient Hospital Stay: Admit: 2021-09-20 | Payer: MEDICARE | Primary: Nurse Practitioner

## 2021-09-20 ENCOUNTER — Encounter

## 2021-09-20 ENCOUNTER — Ambulatory Visit: Admit: 2021-09-20 | Payer: MEDICARE | Primary: Nurse Practitioner

## 2021-09-20 ENCOUNTER — Ambulatory Visit
Admit: 2021-09-20 | Discharge: 2021-09-20 | Payer: MEDICARE | Attending: Orthopaedic Surgery | Primary: Nurse Practitioner

## 2021-09-20 DIAGNOSIS — R52 Pain, unspecified: Secondary | ICD-10-CM

## 2021-09-20 DIAGNOSIS — M25512 Pain in left shoulder: Secondary | ICD-10-CM
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
## 2021-10-06 ENCOUNTER — Ambulatory Visit: Admit: 2021-10-06 | Discharge: 2021-10-06 | Payer: MEDICARE | Primary: Nurse Practitioner

## 2021-10-06 ENCOUNTER — Ambulatory Visit
Admit: 2021-10-06 | Discharge: 2021-10-06 | Payer: MEDICARE | Attending: Student in an Organized Health Care Education/Training Program | Primary: Nurse Practitioner

## 2021-10-06 DIAGNOSIS — M25512 Pain in left shoulder: Secondary | ICD-10-CM

## 2021-10-06 DIAGNOSIS — M25551 Pain in right hip: Secondary | ICD-10-CM
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
## 2021-10-06 NOTE — Progress Notes (Addendum)
Patient:  Tim [name], DOB 1956-03-24  Date of Service:  10/06/21      Do you currently have any of the following:    Fever: No  Headache:  No  Cough: No  Shortness of breath: No  Exposed to anyone with these symptoms: No       Patient presents to [facility] with complaints of left shoulder/bilateral knee pain that started 7 years ago and has been getting worse.     He states the pain began following No specific cause    Pain is constant and is described as aching, throbbing, shooting, stabbing, sharp, and burning. He rates the pain as a 10/10 on his worst day , 5/10 on his best day, and a 7/10 on average on the VAS scale.      Pain does radiate to right arm and left arm. He  has numbness, tingling of the right arm and left arm.    Alleviating factors include: rest.  Aggravating factors include:  movement, bending, pressure. He states that the pain does keep him from sleeping at night. He took his last dose of Motrin and Tylenol yesterday.     He is  on NSAIDS and  is not on anticoagulation medications     Previous treatments: medications..      Personal Expectations from this treatment: increase activity and decrease pain    Ht 6\' 1"  (1.854 m)    Wt 160 lb (72.6 kg)    BMI 21.11 kg/m??     No LMP for male patient.

## 2021-10-13 MED ORDER — IBUPROFEN 200 MG PO TABS
200 MG | ORAL_TABLET | Freq: Four times a day (QID) | ORAL | 0 refills | Status: AC | PRN
Start: 2021-10-13 — End: 2021-11-29

## 2021-10-13 NOTE — Telephone Encounter (Signed)
Patients last appointment 07/02/2021.  Patients next scheduled appointment   Future Appointments   Date Time Provider Department Center   11/15/2021  3:30 PM Candida Peeling, APRN - CNP [facility]   12/09/2021  1:20 PM Garnett Farm, MD [facility]   12/31/2021  1:00 PM Candida Peeling, APRN - CNP [facility]

## 2021-11-15 ENCOUNTER — Encounter: Payer: MEDICARE | Attending: Nurse Practitioner | Primary: Nurse Practitioner

## 2021-11-15 NOTE — Telephone Encounter (Signed)
-----   Message from [name] sent at 11/15/2021  3:05 PM EDT -----  Subject: Appointment Request    Reason for Call: Established Patient Appointment needed: Routine Pre-Op    QUESTIONS    Reason for appointment request? Available appointments did not meet   patient need     Additional Information for Provider? Patient is having diarrhea and is   unable to come into the appt today. He is having surgery on PREOP   12/01/2021, left hand and wrist surgery, will be performed at [facility] with Dr Brigid Re, EGK unknown. There are no available appts   until mid may. Please advise if he can get in sooner for appt for pre op.  ---------------------------------------------------------------------------  --------------  Tim [name] INFO  [phone]; OK to leave message on voicemail  ---------------------------------------------------------------------------  --------------  SCRIPT ANSWERS  COVID Screen: [place] Si

## 2021-11-15 NOTE — Telephone Encounter (Signed)
Noted, Thanks.

## 2021-11-15 NOTE — Telephone Encounter (Signed)
I contacted patient, and he rescheduled his pre-op appointment for 11/26/21 since he was ill and unable to make it today. Patient was told to make sure he brings all pre-op paperwork. Just an FYI.

## 2021-11-19 NOTE — Discharge Instructions (Signed)
DISCHARGE INSTRUCTIONS TO HOME FOLLOWING SURGERY    ACTIVITY INSTRUCTIONS:    Elevate extremity to help reduce swelling.   Use Purple Pillow for elevation of hand/arm   Use sling as needed.   No heavy lifting, pushing, pulling or strenuous activity    WOUND/DRESSING INSTRUCTIONS:  Always ensure you and your care giver clean hands before and after caring for the wound.  Keep the dressing, cast, or splint clean and dry until seen in office. Do not remove dressing   OK to shower- Keep your dressing dry.  Can ice surgical region to help reduce swelling, Do not apply ice to fingers or toes       MEDICATION INSTRUCTIONS:  Take pain medicine as directed.    When taking pain medications, you may experience dizziness or drowsiness.   Do not drink alcohol or drive when taking these medications.  Do not take any other medication containing acetaminophen (Tylenol) while taking the prescribed pain medication.    Ibuprofen, Aleve, Motrin, or Naproxen are okay but should not be taken on an empty stomach.    *Watch for these significant complications:  Call physician if these or any other problems occur:  Fever over 101, redness, swelling or warmth at the operative site  Unrelieved nausea    Foul smelling or cloudy drainage at the operative site   Unrelieved pain  Breathing issues such as shortness of breath or difficulty breathing    Blood soaked dressing. (Some oozing may be normal)     Numb, pale, blue, cold or tingling extremity       Make an appointment to be seen 8-10 days after surgery  FOLLOW-UP CARE:    Dr. Adrian L. Butler   [address] [place] [address]  [phone]

## 2021-11-24 ENCOUNTER — Encounter

## 2021-11-26 ENCOUNTER — Encounter: Payer: MEDICARE | Attending: Nurse Practitioner | Primary: Nurse Practitioner

## 2021-11-29 ENCOUNTER — Encounter: Payer: MEDICARE | Attending: Nurse Practitioner | Primary: Nurse Practitioner

## 2021-11-29 ENCOUNTER — Encounter: Payer: MEDICARE | Attending: Physician Assistant | Primary: Nurse Practitioner

## 2021-11-29 NOTE — Telephone Encounter (Signed)
Spoke with [facility]. PAT SEB.  She spoke with patient's wife regarding medical clearance. Cloyce has been unable to obtain appt for clearance. From Michelle's standpoint, no need for medical clearance unless Dr Charm Barges is requesting it.  They will do blood work (BMP and Dilantin level) morning of surgery.     Per Dr Charm Barges no need for medical clearance.

## 2021-11-29 NOTE — Progress Notes (Signed)
[facility] PRE-ADMISSION TESTING INSTRUCTIONS    The Preadmission Testing patient is instructed accordingly using the following criteria (check applicable):    ARRIVAL INSTRUCTIONS:  [x] Parking the day of Surgery is located in the Main Entrance lot.  Upon entering the door, make an immediate right to the surgery reception desk    [x] Bring photo ID and insurance card    [] Bring in a copy of Living will or Durable Power of attorney papers.    [x] Please be sure to arrange for responsible adult to provide transportation to and from the hospital    [x] Please arrange for responsible adult to be with you for the 24 hour period post procedure due to having anesthesia    [x] If you awake am of surgery not feeling well or have temperature >100 please call [phone]    GENERAL INSTRUCTIONS:    [x] Nothing by mouth after midnight, including gum, candy, mints or water    [x] You may brush your teeth, but do not swallow any water    [x] Take medications as instructed with 1-2 oz of water    [x] Stop herbal supplements and vitamins 5 days prior to procedure    [] Follow preop dosing of blood thinners per physician instructions    [] Take 1/2 dose of evening insulin, but no insulin after midnight    [] No oral diabetic medications after midnight    [] If diabetic and have low blood sugar or feel symptomatic, take 1-2oz apple juice only    [] Bring inhalers day of surgery    [] Bring C-PAP/ Bi-Pap day of surgery    [] Bring urine specimen day of surgery    [x] Shower or bath with soap, lather and rinse well, AM of Surgery, no lotion, powders or creams to surgical site    [] Follow bowel prep as instructed per surgeon    [x] No tobacco products within 24 hours of surgery     [x] No alcohol or illegal drug use within 24 hours of surgery.    [x] Jewelry, body piercing's, eyeglasses, contact lenses and dentures are not permitted into surgery (bring cases)      [] Please do not wear any nail polish,  make up or hair products on the day of surgery    [x] You can expect a call the business day prior to procedure to notify you if your arrival time changes    [x] If you receive a survey after surgery we would greatly appreciate your comments    [] Parent/guardian of a minor must accompany their child and remain on the premises  the entire time they are under our care     [] Pediatric patients may bring favorite toy, blanket or comfort item with them    [] A caregiver or family member must remain with the patient during their stay if they are mentally handicapped, have dementia, disoriented or unable to use a call light or would be a safety concern if left unattended    [x] Please notify surgeon if you develop any illness between now and time of surgery (cold, cough, sore throat, fever, nausea, vomiting) or any signs of infections  including skin, wounds, and dental.    [x]  The Outpatient Pharmacy is available to fill your prescription here on your day of surgery, ask your preop nurse for details    [] Other instructions    EDUCATIONAL MATERIALS   PROVIDED:    [] PAT Preoperative Education Packet/Booklet     [] Medication List    [] Transfusion bracelet applied with instructions    [] Shower with soap, lather and rinse well, and use CHG wipes provided the evening before surgery as instructed    [] Incentive spirometer with instructions

## 2021-12-01 ENCOUNTER — Inpatient Hospital Stay: Payer: Medicare (Managed Care) | Attending: Orthopaedic Surgery

## 2021-12-01 ENCOUNTER — Ambulatory Visit: Admit: 2021-12-01 | Payer: MEDICARE | Primary: Nurse Practitioner

## 2021-12-01 LAB — BASIC METABOLIC PANEL
Anion Gap: 8 mmol/L (ref 7–16)
BUN: 8 mg/dL (ref 6–23)
CO2: 21 mmol/L — ABNORMAL LOW (ref 22–29)
Calcium: 8.3 mg/dL — ABNORMAL LOW (ref 8.6–10.2)
Chloride: 111 mmol/L — ABNORMAL HIGH (ref 98–107)
Creatinine: 0.7 mg/dL (ref 0.7–1.2)
Est, Glom Filt Rate: >60 mL/min/{1.73_m2} (ref >=60)
Glucose: 97 mg/dL (ref 74–99)
Potassium: 3.4 mmol/L — ABNORMAL LOW (ref 3.5–5.0)
Sodium: 140 mmol/L (ref 132–146)

## 2021-12-01 LAB — PHENYTOIN LEVEL, TOTAL: Phenytoin Lvl: 4.4 ug/mL — ABNORMAL LOW (ref 10.0–20.0)

## 2021-12-01 MED ORDER — MIDAZOLAM HCL 2 MG/2ML IJ SOLN
2 MG/ML | INTRAMUSCULAR | Status: AC
Start: 2021-12-01 — End: ?

## 2021-12-01 MED ORDER — OXYCODONE-ACETAMINOPHEN 5-325 MG PO TABS
5-325 MG | ORAL_TABLET | Freq: Four times a day (QID) | ORAL | 0 refills | Status: DC | PRN
Start: 2021-12-01 — End: 2021-12-09
  Filled 2021-12-01: qty 28, 7d supply, fill #0

## 2021-12-01 MED ORDER — HYDROMORPHONE HCL 1 MG/ML IJ SOLN
1 MG/ML | INTRAMUSCULAR | Status: DC | PRN
Start: 2021-12-01 — End: 2021-12-01

## 2021-12-01 MED ORDER — NORMAL SALINE FLUSH 0.9 % IV SOLN
0.9 % | Freq: Two times a day (BID) | INTRAVENOUS | Status: DC
Start: 2021-12-01 — End: 2021-12-01

## 2021-12-01 MED ORDER — SODIUM CHLORIDE 0.9 % IV SOLN
0.9 % | INTRAVENOUS | Status: DC | PRN
Start: 2021-12-01 — End: 2021-12-01

## 2021-12-01 MED ORDER — NORMAL SALINE FLUSH 0.9 % IV SOLN
0.9 % | INTRAVENOUS | Status: DC | PRN
Start: 2021-12-01 — End: 2021-12-01

## 2021-12-01 MED ORDER — PROPOFOL 500 MG/50ML IV EMUL
500 MG/50ML | INTRAVENOUS | Status: DC | PRN
Start: 2021-12-01 — End: 2021-12-01
  Administered 2021-12-01: 12:00:00 200 via INTRAVENOUS
  Administered 2021-12-01: 12:00:00 75 via INTRAVENOUS

## 2021-12-01 MED ORDER — SODIUM CHLORIDE 0.9 % IV SOLN
0.9 % | INTRAVENOUS | Status: DC
Start: 2021-12-01 — End: 2021-12-01

## 2021-12-01 MED ORDER — CEFAZOLIN SODIUM 2 G IJ SOLR
2 g | INTRAMUSCULAR | Status: AC
Start: 2021-12-01 — End: 2021-12-01
  Administered 2021-12-01: 12:00:00 2000 mg via INTRAVENOUS

## 2021-12-01 MED ORDER — BUPIVACAINE-EPINEPHRINE (PF) 0.5% -1:200000 IJ SOLN
INTRAMUSCULAR | Status: DC | PRN
Start: 2021-12-01 — End: 2021-12-01
  Administered 2021-12-01 (×2): 10 via INTRADERMAL

## 2021-12-01 MED ORDER — GLYCOPYRROLATE 1 MG/5ML IJ SOLN
1 MG/5ML | INTRAMUSCULAR | Status: DC | PRN
Start: 2021-12-01 — End: 2021-12-01
  Administered 2021-12-01: 13:00:00 .2 via INTRAVENOUS

## 2021-12-01 MED ORDER — PROCHLORPERAZINE EDISYLATE 10 MG/2ML IJ SOLN
10 MG/2ML | Freq: Once | INTRAMUSCULAR | Status: DC | PRN
Start: 2021-12-01 — End: 2021-12-01

## 2021-12-01 MED ORDER — LIDOCAINE HCL (PF) 2 % IJ SOLN
2 % | INTRAMUSCULAR | Status: DC | PRN
Start: 2021-12-01 — End: 2021-12-01
  Administered 2021-12-01: 12:00:00 50 via INTRAVENOUS

## 2021-12-01 MED ORDER — ONDANSETRON HCL 4 MG/2ML IJ SOLN
4 MG/2ML | INTRAMUSCULAR | Status: AC
Start: 2021-12-01 — End: ?

## 2021-12-01 MED ORDER — PROPOFOL 500 MG/50ML IV EMUL
500 MG/50ML | INTRAVENOUS | Status: AC
Start: 2021-12-01 — End: ?

## 2021-12-01 MED ORDER — FENTANYL CITRATE (PF) 100 MCG/2ML IJ SOLN
100 MCG/2ML | INTRAMUSCULAR | Status: AC
Start: 2021-12-01 — End: ?

## 2021-12-01 MED ORDER — ONDANSETRON HCL 4 MG/2ML IJ SOLN
4 MG/2ML | INTRAMUSCULAR | Status: DC | PRN
Start: 2021-12-01 — End: 2021-12-01
  Administered 2021-12-01: 13:00:00 4 via INTRAVENOUS

## 2021-12-01 MED ORDER — HYDROMORPHONE HCL 1 MG/ML IJ SOLN
1 MG/ML | INTRAMUSCULAR | Status: DC | PRN
Start: 2021-12-01 — End: 2021-12-01
  Administered 2021-12-01: 14:00:00 0.5 mg via INTRAVENOUS

## 2021-12-01 MED ORDER — PROPOFOL 200 MG/20ML IV EMUL
200 MG/20ML | INTRAVENOUS | Status: AC
Start: 2021-12-01 — End: ?

## 2021-12-01 MED ORDER — MIDAZOLAM HCL 2 MG/2ML IJ SOLN
2 MG/ML | INTRAMUSCULAR | Status: DC | PRN
Start: 2021-12-01 — End: 2021-12-01
  Administered 2021-12-01: 12:00:00 2 via INTRAVENOUS

## 2021-12-01 MED ORDER — FENTANYL CITRATE (PF) 100 MCG/2ML IJ SOLN
100 MCG/2ML | INTRAMUSCULAR | Status: DC | PRN
Start: 2021-12-01 — End: 2021-12-01
  Administered 2021-12-01 (×2): 50 via INTRAVENOUS

## 2021-12-01 MED ORDER — KETOROLAC TROMETHAMINE 30 MG/ML IJ SOLN
30 MG/ML | INTRAMUSCULAR | Status: AC
Start: 2021-12-01 — End: ?

## 2021-12-01 MED ORDER — PHENYLEPHRINE HCL 10 MG/ML SOLN (MIXTURES ONLY)
10 MG/ML | Status: DC | PRN
Start: 2021-12-01 — End: 2021-12-01
  Administered 2021-12-01: 13:00:00 100 via INTRAVENOUS

## 2021-12-01 MED ORDER — LIDOCAINE HCL (PF) 2 % IJ SOLN
2 % | INTRAMUSCULAR | Status: AC
Start: 2021-12-01 — End: ?

## 2021-12-01 MED ORDER — SODIUM CHLORIDE 0.9 % IV SOLN
0.9 % | INTRAVENOUS | Status: DC | PRN
Start: 2021-12-01 — End: 2021-12-01
  Administered 2021-12-01: 12:00:00 via INTRAVENOUS

## 2021-12-01 MED ORDER — BUPIVACAINE HCL (PF) 0.5 % IJ SOLN
0.5 % | INTRAMUSCULAR | Status: AC
Start: 2021-12-01 — End: ?
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
## 2021-12-01 NOTE — Anesthesia Post-Procedure Evaluation (Addendum)
Department of Anesthesiology  Postprocedure Note    Patient: Tim [name]  MRN: [id number]  Birthdate: [birth year]  Date of evaluation: 12/01/2021      Procedure Summary     Date: 12/01/21 Room / Location: [facility] OR 03 / [facility]    Anesthesia Start: [phone] Anesthesia Stop: [phone]    Procedure: LEFT CARPAL TUNNEL RELEASE LEFT THUMB   METACARPOPHALANGEAL JOINT FUSION (Left: Hand) Diagnosis:       Carpal tunnel syndrome on left      Arthritis of left hand      (Carpal tunnel syndrome on left [G56.02])      (Arthritis of left hand [M19.042])    Surgeons: Garnett Farm, MD Responsible Provider: Aida Raider, MD    Anesthesia Type: General ASA Status: 3          Anesthesia Type: General    Aldrete Phase I: Aldrete Score: 10    Aldrete Phase II:        Anesthesia Post Evaluation    Patient location during evaluation: PACU  Patient participation: complete - patient participated  Level of consciousness: awake and alert  Pain score: 2  Airway patency: patent  Nausea & Vomiting: no nausea and no vomiting  Complications: no  Cardiovascular status: blood pressure returned to baseline  Respiratory status: acceptable, spontaneous ventilation and room air  Hydration status: euvolemic  Multimodal analgesia pain management approach

## 2021-12-01 NOTE — Brief Op Note (Signed)
Brief Postoperative Note      Patient: Tim [name]  Date of Birth: Feb 21, 1956  MRN: [id number]    Date of Procedure: 31-Dec-2021    Pre-Op Diagnosis Codes:     * Carpal tunnel syndrome on left [G56.02]     * Arthritis of left hand [M19.042]    Post-Op Diagnosis: Same       Procedure(s):  LEFT CARPAL TUNNEL RELEASE LEFT THUMB   METACARPOPHALANGEAL JOINT FUSION    Surgeon(s):  Garnett Farm, MD    Assistant:  Surgical Assistant: Providence Lanius, RN    Anesthesia: General    Estimated Blood Loss (mL): Minimal    Complications: None    Specimens:   * No specimens in log *    Implants:  Implant Name Type Inv. Item Serial No. Manufacturer Lot No. LRB No. Used Action   K WIRE FIX L6IN DIA0.045IN [id number]] [employer]] - [id number]  K WIRE FIX L6IN DIA0.045IN [id number]] [employer]]  [facility] [employer]-WD [id number] Left 2 Implanted   IMPL KWIRE FIXATION   [employer] - [id number] Screw/Plate/Nail/Rod IMPL KWIRE FIXATION   [employer]  SMITH-PMM  Left 1 Implanted         Drains: * No LDAs found *    Findings: see op note      Electronically signed by Garnett Farm, MD on 2021-12-31 at 10:09 AM

## 2021-12-01 NOTE — Progress Notes (Signed)
CLINICAL PHARMACY NOTE: MEDS TO BEDS    Total # of Prescriptions Filled: 1   The following medications were delivered to the patient:  Percocet 5-325 mg    Additional Documentation:
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
## 2021-12-01 NOTE — Op Note (Signed)
[facility]                  [address] [place], [place] [address]                                OPERATIVE REPORT    PATIENT NAME: Tim [name]                     DOB:        Dec 24, 1955  MED REC NO:   [id number]                            ROOM:  ACCOUNT NO:   [id number]                           ADMIT DATE: 12/01/2021  PROVIDER:     Lu Duffel, MD    DATE OF PROCEDURE:  12/01/2021    PREOPERATIVE DIAGNOSES:  1.  Left carpal tunnel syndrome.  2.  Left thumb MCP joint arthritis.    POSTOPERATIVE DIAGNOSES:  1.  Left carpal tunnel syndrome.  2.  Left thumb MCP joint arthritis.    PROCEDURES PERFORMED:  1.  Left carpal tunnel release.  2.  Left thumb metacarpophalangeal joint fusion using a tension band and  K-wire technique.    SURGEON:  Lu Duffel, M.[name].    ANESTHESIA:  1.  General.  2.  Local anesthetic by surgeon consisting of approximately 20 mL of 1%  lidocaine with epinephrine.    ASSISTANT:  None.    TOTAL TOURNIQUET TIME:  44 minutes at 250 mmHg of brachial tourniquet.    ESTIMATED BLOOD LOSS:  Minimal.    FINDINGS:  1.  Evidence of median nerve compression beneath the transverse carpal  ligament that was adequately decompressed with release of the transverse  carpal ligament.  2.  Status post median nerve decompression, there was evidence of  underlying median nerve compression that was adequately decompressed and  the nerve was intact throughout its visualized course including the  distal forearm fascia through the carpal tunnel to its trifurcation  distally.  3.  There was severe end-stage arthrosis and gross instability of the  thumb metacarpophalangeal joint.  Status post debridement, overall  alignment was assessed intraoperatively under fluoroscopy and use of an  intraoperative goniometer with a fusion angle of approximately 20 to 25  degrees of flexion and slight pronation and neutral alignment on AP  views.  There was a stable fusion mass that  was assessed  intraoperatively, status post fixation with tension band technique.    COMPLICATIONS:  None.    DISPOSITION:  The patient remained stable throughout the procedure and  was taken to the recovery room.    OPERATIVE INDICATIONS:  The patient is a very pleasant 66 year old  gentleman with persistent recalcitrant left carpal tunnel syndrome and  left thumb MCP joint arthritis.  After failing conservative management,  he wished to proceed with surgical intervention.  Risks, benefits,  alternatives, and complications of surgery were explained to him  including, but not limited to risk of infection, damage to nerves,  vessels, or tendons, failure to improve his symptoms or worsening of his  symptoms, recurrence of symptoms, pillar pain; thenar pain; hypothenar  pain, malunion, nonunion, symptomatic hardware, possible need for  therapy,  additional surgery, and unforeseen complications.  He  understood and wished to proceed.    DESCRIPTION OF PROCEDURE:  The patient was identified in the holding  area.  The left hand and thumb were identified as the operative sites.   He was then seen by Anesthesia, taken to the operating room, placed  supine on the table, and underwent general anesthesia per Anesthesia  Department.  All bony prominences were well padded.  A well-padded arm  tourniquet was placed.  The left upper extremity was prepared and draped  in the standard sterile fashion.  Arm was exsanguinated.  Tourniquet was  inflated to 250 mmHg.  Total tourniquet time was 44 minutes.    An incision in line with the radial border of the ring finger extending  from Kaplan's cardinal line to within 1 cm of the volar wrist flexion  crease was then created followed by blunt dissection and identification  of the superficial palmar fascia, which was incised longitudinally  followed by blunt dissection and identification of the contents of  Guyon's canal, which was reflected off the distal forearm fascia to  clearly  identify the distal forearm fascia and entered into the  transverse carpal ligament.  With this isolated, the 15-blade scalpel  was used to incise the distal forearm fascia and the proximal portion of  the transverse carpal ligament was identified _____ median nerve, which  was then traced distally.  Distal dissection was then performed  identifying and protecting the superficial palmar branch of the  superficial palmar arch.  This was retracted.  The underlying median  nerve was identified.  With the arch and underlying nerve protected, the  remaining portions of the transverse carpal ligament was divided off the  underlying median nerve.  The remaining portions of the proximal  transverse carpal ligament and distal forearm fascia was then released  using blunt-tipped Metzenbaum scissors in a distal proximal slide  technique.  The underlying median nerve was then inspected.  There was  evidence of median nerve compression that was adequately decompressed  with release of the transverse carpal ligament.  The nerve was intact  throughout its visualized course including distal forearm fascia, carpal  tunnel proper, and its trifurcation distally.  The wound was copiously  irrigated out.  Local anesthetic infiltrated into the soft tissues.   Skin was closed with nylon suture.    Attention was directed towards the thumb MCP joint fusion.  Fluoroscopy  was then brought in.  AP and lateral views showed end-stage arthritis as  well as gross instability to the thumb.  With this confirmed, a dorsal  incision was then made over the metacarpophalangeal joint.  Flaps were  elevated.  The extensor mechanism was split longitudinally and  retracted.  Dorsal capsular incision was then made.  The joint was  hyperflexed.  Collateral ligaments were released off of the metacarpal  to expose the underlying metacarpal head and proximal phalanx articular  surfaces, which were severely arthritic.  A rongeur as well as a  high-speed bur with  copious amounts of irrigation was used to remove the  subchondral bone to expose healthy cancellous bone for the fusion.  With  this completed, the contouring was undertaken with the use of  intraoperative goniometer to achieve a fusion angle of approximately 20  to 25 degrees of flexion.  Neutral alignment on the AP view.  This was  preliminarily pinned in a good position confirmed with the goniometer.   This was then followed  by two 0.045 K-wires placed antegrade across the  fusion site under fluoroscopic imaging.  A 22-gauge wire was then passed  distally transversely deep to the underlying K-wires and then  figure-of-eight around the proximal K-wires and tensioned to achieve  excellent compression under fluoroscopic imaging.  The tension band was  then trimmed.  The K-wires were backed out slightly and buried  proximally.  Final images confirmed excellent alignment in both the AP  and lateral planes with good compression and a good fusion angle  assessed with intraoperative goniometer.  The wound was copiously  irrigated out.  The extensor mechanism was repaired with inverted 4-0  FiberWire sutures.  Skin was closed with Monocryl and Dermabond.  Prior  to skin closure, tourniquet was deflated.  Hemostasis was achieved with  bipolar cautery.  Wound was copiously irrigated out.  Local anesthetic  infiltrated into the soft tissues.  A bulky sterile dressing and splint  was applied.  The patient remained throughout the procedure and was  taken to the recovery room.        Lu Duffel, MD    [name]: 12/01/2021 10:21:30       T: 12/01/2021 10:25:24     [id number]  Job#: [id number]     Doc#: [id number]    CC:

## 2021-12-02 LAB — EKG 12-LEAD
Atrial Rate: 66 {beats}/min
P Axis: 14 degrees
P-R Interval: 138 ms
Q-T Interval: 434 ms
QRS Duration: 98 ms
QTc Calculation (Bazett): 454 ms
R Axis: -46 degrees
T Axis: 48 degrees
Ventricular Rate: 66 {beats}/min

## 2021-12-09 ENCOUNTER — Telehealth

## 2021-12-09 ENCOUNTER — Ambulatory Visit: Admit: 2021-12-09 | Discharge: 2021-12-09 | Payer: MEDICARE | Primary: Nurse Practitioner

## 2021-12-09 ENCOUNTER — Ambulatory Visit
Admit: 2021-12-09 | Discharge: 2021-12-09 | Payer: MEDICARE | Attending: Orthopaedic Surgery | Primary: Nurse Practitioner

## 2021-12-09 DIAGNOSIS — M19042 Primary osteoarthritis, left hand: Secondary | ICD-10-CM

## 2021-12-09 MED ORDER — OXYCODONE-ACETAMINOPHEN 5-325 MG PO TABS
5-325 MG | ORAL_TABLET | Freq: Four times a day (QID) | ORAL | 0 refills | Status: AC | PRN
Start: 2021-12-09 — End: 2021-12-16

## 2021-12-09 MED ORDER — DOXYCYCLINE HYCLATE 100 MG PO TABS
100 MG | ORAL_TABLET | Freq: Two times a day (BID) | ORAL | 0 refills | Status: AC
Start: 2021-12-09 — End: 2021-12-19

## 2021-12-09 NOTE — Progress Notes (Signed)
Sutures removed from op sites left hand.    A removable fiberglass thumb spica splint fabricated and applied to left wrist.

## 2021-12-09 NOTE — Progress Notes (Signed)
8 days postop carpal tunnel lease and left thumb MCP joint fusion.  Overall is doing well.  No complaints.    Physical exam: Incision healing nicely.  Good digital flexion extension of the little finger.  Sensation intact of thumb index and middle fingers.  Fusion is stable in good alignment.  He has intact thumb IP joint flexion extension.  Opposition to tip of the middle finger.    X-rays in the office today: AP lateral bleak of his left thumb demonstrate a thumb MCP joint fusion with tension band technique in good alignment.  Impression office x-rays: Stable left thumb MCP joint fusion    Assessment 8 days postop    Plan    Today's fines were explained.  Sutures removed.  Home exercises including thumb IP joint flexion extension and opposition as well as digital range of motion exercises to the index little fingers were explained demonstrated and provided.  Patient also closely placed on some antibiotics.  I did provide him doxycycline for some localized hyperemia.  In addition he requested a refill on pain medications.  He will follow-up in 4 to 6 weeks with new x-rays of the thumb.  If good healing is present we will discontinue bracing and allow increase use of the thumb as tolerated.      Informed consent was obtained for continued opioid treatment. Patient agrees to continue the current treatment and agrees the benefits of opioid treatment ( decreased pain, improved function) continue to outweigh the potential risks ( confusion, change in thinking ability, depression, dry mouth, nausea, constipation, vomiting, impaired coordination/balance, sleepiness, damage liver or kidneys, opioid-induced hyperalgesia, physical dependence, addiction, withdrawal, respiratory depression and even death).

## 2021-12-09 NOTE — Telephone Encounter (Signed)
Rx-ray order

## 2021-12-14 ENCOUNTER — Encounter: Payer: MEDICARE | Attending: Physician Assistant | Primary: Nurse Practitioner

## 2021-12-15 ENCOUNTER — Encounter

## 2021-12-15 LAB — URINALYSIS
Bilirubin, Urine: NEGATIVE
Blood, Urine: NEGATIVE
Glucose, Ur: NEGATIVE
Ketones, Urine: NEGATIVE
Nitrite, Urine: NEGATIVE
Specific Gravity, Urine: 1.025
Urobilinogen, Urine: 0.2
pH, UA: 5.5 (ref 4.5–8.0)

## 2021-12-15 LAB — COMPREHENSIVE METABOLIC PANEL, FASTING

## 2021-12-15 LAB — TROPONIN: Troponin: 7.8

## 2021-12-15 LAB — LIPID PANEL
Chol/HDL Ratio: 2.1
Cholesterol, Total: 101 mg/dL
HDL: 48 mg/dL (ref 35–70)
LDL Calculated: 38 mg/dL (ref 0–160)
Triglycerides: 78 mg/dL
VLDL: 16 mg/dL

## 2021-12-15 LAB — CBC WITH AUTO DIFFERENTIAL

## 2021-12-15 LAB — TSH: TSH: 1.35 u[IU]/mL

## 2021-12-15 MED ORDER — SULFAMETHOXAZOLE-TRIMETHOPRIM 800-160 MG PO TABS
800-160 MG | ORAL_TABLET | Freq: Two times a day (BID) | ORAL | 0 refills | Status: AC
Start: 2021-12-15 — End: 2021-12-25

## 2021-12-17 ENCOUNTER — Encounter: Payer: MEDICARE | Attending: Physician Assistant | Primary: Nurse Practitioner

## 2021-12-20 ENCOUNTER — Encounter

## 2021-12-31 ENCOUNTER — Ambulatory Visit: Admit: 2021-12-31 | Discharge: 2021-12-31 | Payer: MEDICARE | Primary: Nurse Practitioner

## 2021-12-31 ENCOUNTER — Ambulatory Visit
Admit: 2021-12-31 | Discharge: 2021-12-31 | Payer: MEDICARE | Attending: Nurse Practitioner | Primary: Nurse Practitioner

## 2021-12-31 DIAGNOSIS — M79642 Pain in left hand: Secondary | ICD-10-CM

## 2021-12-31 DIAGNOSIS — R509 Fever, unspecified: Secondary | ICD-10-CM

## 2021-12-31 LAB — POCT COVID-19 & INFLUENZA A/B
Influenza A Antigen, POC: NEGATIVE
Influenza B Antigen, POC: NEGATIVE
Lot/Kit Number: 2340087
SARS-COV-2, POC: NOT DETECTED

## 2021-12-31 MED ORDER — NUTRITIONAL SUPPLEMENT PO LIQD
Freq: Two times a day (BID) | ORAL | 4 refills | Status: AC
Start: 2021-12-31 — End: 2022-01-30

## 2021-12-31 MED ORDER — DOXYCYCLINE HYCLATE 100 MG PO TABS
100 MG | ORAL_TABLET | Freq: Two times a day (BID) | ORAL | 0 refills | Status: AC
Start: 2021-12-31 — End: 2022-01-10
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
## 2022-01-03 NOTE — Other (Signed)
REVIEWED     NO NEW ORDERS IMPRESSION:  No acute bony abnormality.  Degenerative changes seen within the 1st  metatarsophalangeal joint.  PLEASE CALL PATIENT WITH RESULTS

## 2022-01-04 NOTE — Telephone Encounter (Signed)
NP scheduled from the [facility]que.    Patient Appointment Form:      PCP: Dr. Francella Solian  Referring: Dr. Francella Solian    Has the Patient:    Seen a Cardiologist? no    Had a heart catheterization? no    Had heart surgery? no    Had a stress test or nuclear stress test? no    Had an echocardiogram? no    Had a vascular ultrasound? no    Had a 24/48 heart monitor or extended cardiac event monitor? no    Had recent blood work in the last 6 months?     Had a pacemaker/ICD/ILR implant? no    Seen an Electrophysiologist? no        Will send records via: No prior Cardiology hx or recs - PCP recs in Epic       Date & time of appointment:  01/12/22 @ 1:20 with Dr. Alto Denver- "[facility] [place]"

## 2022-01-12 ENCOUNTER — Encounter: Payer: MEDICARE | Attending: Cardiovascular Disease | Primary: Nurse Practitioner

## 2022-01-18 ENCOUNTER — Encounter: Payer: MEDICARE | Attending: Physician Assistant | Primary: Nurse Practitioner

## 2022-02-04 ENCOUNTER — Encounter: Payer: MEDICAID | Attending: Physician Assistant | Primary: Nurse Practitioner

## 2022-02-08 ENCOUNTER — Encounter: Payer: MEDICAID | Attending: Physician Assistant | Primary: Nurse Practitioner

## 2022-02-08 ENCOUNTER — Encounter

## 2022-02-08 NOTE — Other (Signed)
REVIEWED   He definitely has some reduction in velocity does he have an appointment with vascular set up?    PLEASE CALL PATIENT WITH RESULTS

## 2022-02-23 ENCOUNTER — Encounter: Payer: MEDICARE | Attending: Cardiovascular Disease | Primary: Nurse Practitioner

## 2022-02-23 DIAGNOSIS — R0789 Other chest pain: Secondary | ICD-10-CM

## 2022-03-28 ENCOUNTER — Encounter

## 2022-03-28 MED ORDER — DILANTIN 30 MG PO CAPS
30 MG | ORAL_CAPSULE | Freq: Every evening | ORAL | 1 refills | Status: AC
Start: 2022-03-28 — End: ?

## 2022-03-28 NOTE — Telephone Encounter (Signed)
Patients last appointment 12/31/2021.  Patients next scheduled appointment   Future Appointments   Date Time Provider Department Center   03/29/2022  1:30 PM Lawson Radar, MD [facility]   04/13/2022  3:00 PM Candida Peeling, APRN - CNP [facility]

## 2022-03-28 NOTE — Telephone Encounter (Signed)
-----   Message from Shirley Muscat sent at 03/28/2022  1:25 PM EDT -----  Subject: Results Request    QUESTIONS  Results: ultra sound of right let and Dylantin; Ordered by:   Date Performed:   ---------------------------------------------------------------------------  --------------  Cleotis Lema INFO    [phone]; OK to leave message on voicemail  ---------------------------------------------------------------------------  --------------

## 2022-03-29 ENCOUNTER — Encounter: Payer: MEDICARE | Attending: Cardiovascular Disease | Primary: Nurse Practitioner

## 2022-04-13 ENCOUNTER — Encounter: Payer: MEDICARE | Attending: Nurse Practitioner | Primary: Nurse Practitioner

## 2022-04-13 NOTE — Telephone Encounter (Signed)
Called patient in regards to today's missed appointment. No answer. Left message to contact the office if patient would like to reschedule missed appointment.     To provide quality care and timely appointments to all our patients, you may be dismissed from the practice if you do not show for three (3) scheduled appointments within a 12-month period.

## 2022-04-22 LAB — HEPATIC FUNCTION PANEL
ALT: 18 U/L
AST: 13 U/L — ABNORMAL LOW
Albumin: 2.6 — ABNORMAL LOW
Alkaline Phosphatase: 223 U/L — ABNORMAL HIGH
Total Bilirubin: 0.2 mg/dL (ref 0.1–1.4)
Total Protein: 6.4 g/dL

## 2022-04-28 ENCOUNTER — Inpatient Hospital Stay: Admit: 2022-04-28 | Discharge: 2022-04-28 | Disposition: A | Discharge status: Home / Self Care | Payer: MEDICARE

## 2022-04-28 ENCOUNTER — Encounter
Admit: 2022-04-28 | Discharge: 2022-04-28 | Payer: MEDICARE | Attending: Cardiovascular Disease | Primary: Nurse Practitioner

## 2022-04-28 DIAGNOSIS — R0789 Other chest pain: Secondary | ICD-10-CM

## 2022-04-28 DIAGNOSIS — L309 Dermatitis, unspecified: Secondary | ICD-10-CM

## 2022-04-28 MED ORDER — SODIUM CHLORIDE (PF) 0.9 % IJ SOLN
0.9 % | Freq: Once | INTRAMUSCULAR | Status: DC
Start: 2022-04-28 — End: 2022-04-28

## 2022-04-28 MED ORDER — METOPROLOL SUCCINATE ER 25 MG PO TB24
25 MG | ORAL_TABLET | Freq: Two times a day (BID) | ORAL | 5 refills | Status: DC
Start: 2022-04-28 — End: 2022-04-29

## 2022-04-28 MED ORDER — HYDROXYZINE PAMOATE 25 MG PO CAPS
25 MG | ORAL_CAPSULE | Freq: Three times a day (TID) | ORAL | 0 refills | Status: AC | PRN
Start: 2022-04-28 — End: 2022-05-08

## 2022-04-28 MED ORDER — DEXAMETHASONE SODIUM PHOSPHATE 10 MG/ML IJ SOLN
10 MG/ML | Freq: Once | INTRAMUSCULAR | Status: AC
Start: 2022-04-28 — End: 2022-04-28
  Administered 2022-04-28: 19:00:00 10 mg via ORAL

## 2022-04-28 MED ORDER — FAMOTIDINE 20 MG PO TABS
20 MG | Freq: Once | ORAL | Status: AC
Start: 2022-04-28 — End: 2022-04-28
  Administered 2022-04-28: 19:00:00 20 mg via ORAL

## 2022-04-28 MED ORDER — TETANUS-DIPHTHERIA TOXOIDS TD 5-2 LFU IM INJ
5-2 LFU | Freq: Once | INTRAMUSCULAR | Status: AC
Start: 2022-04-28 — End: 2022-04-28
  Administered 2022-04-28: 19:00:00 0.5 mL via INTRAMUSCULAR

## 2022-04-28 MED ORDER — DIPHENHYDRAMINE HCL 25 MG PO TABS
25 MG | Freq: Once | ORAL | Status: AC
Start: 2022-04-28 — End: 2022-04-28
  Administered 2022-04-28: 19:00:00 50 mg via ORAL

## 2022-04-28 MED ORDER — FAMOTIDINE 20 MG PO TABS
20 MG | ORAL_TABLET | Freq: Two times a day (BID) | ORAL | 0 refills | Status: AC
Start: 2022-04-28 — End: 2022-05-08

## 2022-04-28 MED ORDER — PREDNISONE 20 MG PO TABS
20 MG | ORAL_TABLET | ORAL | 0 refills | Status: AC
Start: 2022-04-28 — End: 2022-05-08

## 2022-04-28 MED FILL — DEXAMETHASONE SODIUM PHOSPHATE 10 MG/ML IJ SOLN: 10 MG/ML | INTRAMUSCULAR | Qty: 1

## 2022-04-28 MED FILL — BANOPHEN 25 MG PO TABS: 25 MG | ORAL | Qty: 2

## 2022-04-28 MED FILL — FAMOTIDINE 20 MG PO TABS: 20 MG | ORAL | Qty: 1

## 2022-04-28 MED FILL — TENIVAC 5-2 LFU IM INJ: 5-2 LFU | INTRAMUSCULAR | Qty: 0.5
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
## 2022-04-29 MED ORDER — METOPROLOL SUCCINATE ER 25 MG PO TB24
25 MG | ORAL_TABLET | Freq: Two times a day (BID) | ORAL | 3 refills | Status: AC
Start: 2022-04-29 — End: ?

## 2022-05-27 NOTE — Telephone Encounter (Signed)
Spoke with patients mother  and confirmed pharmacological  stress test appointment on 05/30/2022 at 0945 am. Instructions for test,given including NPO after MN, no caffeine products 12 hours prior to the test , and COVID-19 preprocedure information reviewed with patient, questions answered. Patient verbalized understanding.

## 2022-05-30 ENCOUNTER — Ambulatory Visit: Payer: MEDICARE | Primary: Nurse Practitioner

## 2022-05-30 NOTE — Telephone Encounter (Signed)
-----   Message from Cay Schillings sent at 05/30/2022  9:47 AM EDT -----  Subject: Appointment Request    Reason for Call: Established Patient Appointment needed: Routine ED Follow   Up Visit    QUESTIONS    Reason for appointment request? Available appointments did not meet   patient need     Additional Information for Provider? Patient needs a follow up appt from   the ED. Please call   ---------------------------------------------------------------------------  --------------  Rod Can INFO  [phone]; OK to leave message on voicemail  ---------------------------------------------------------------------------  --------------  SCRIPT ANSWERS

## 2022-05-30 NOTE — Telephone Encounter (Signed)
Message received from [facility]. Please advise for scheduling due to providers schedule.

## 2022-05-30 NOTE — Telephone Encounter (Signed)
Wife LM canceling stress test.  Patient was in hospital all day - now sleeping at home.  Will call back to rescheule.  Tried calling patient, but VM not set up.    Electronically signed by Loretha Stapler on 05/30/2022 at 9:45 AM

## 2022-05-30 NOTE — Telephone Encounter (Signed)
Patient not willing to go to EP.   Does need ED f/u. Appears ED visit was in September 2023.   Patient last seen in office on 01/02/22.  Patient has No Showed on 04/13/22 & 11/26/21

## 2022-05-30 NOTE — Telephone Encounter (Signed)
Please check with patient if they can go to EP. I have nothing available for the moment for this month.

## 2022-06-01 NOTE — Telephone Encounter (Signed)
Notified patient of appointment. Stressed the importance of making the appointment or calling in advanced if he can not make it.

## 2022-06-01 NOTE — Telephone Encounter (Signed)
Please schedule him on Nov 6th and I will put him in my cancellation list. If he has no show and does not want to go to the other office, he will have to wait. Thanks.

## 2022-06-14 ENCOUNTER — Encounter: Payer: MEDICARE | Attending: Nurse Practitioner | Primary: Nurse Practitioner

## 2022-06-15 NOTE — Telephone Encounter (Signed)
Spoke to the patient and contact Charlene on the phone. Reminded of his 745 am stress test at [facility] and where to report. He was instructed on NPO after MN except meds with water but to avoid caffeine for 12 hours prior. He was instructed that he may hold his diuretics but to bring them with him if he does. He verbalized an understanding.

## 2022-06-17 ENCOUNTER — Inpatient Hospital Stay: Payer: MEDICARE | Attending: Cardiovascular Disease | Primary: Nurse Practitioner

## 2022-06-17 ENCOUNTER — Ambulatory Visit: Payer: MEDICARE | Primary: Nurse Practitioner

## 2022-06-17 NOTE — Telephone Encounter (Signed)
No show on stress test today.  Pt not feeling well - secoond cancellation.    Pt will call us back to reschedule when ready.    Electronically signed by Loretha Stapler on 06/17/2022 at 9:10 AM

## 2022-06-20 ENCOUNTER — Encounter: Payer: MEDICARE | Attending: Nurse Practitioner | Primary: Nurse Practitioner

## 2022-06-20 NOTE — Telephone Encounter (Signed)
Patient has No Showed on 06/20/22, 04/13/22 & 11/26/21.     Per 05/30/22 Telephone encounter it was stressed to patient the importance of coming to his appointment or calling in advance to cancel.   Per  04/13/22 telephone encounter patient was contacted and message was left in regards to no show.   No Show letter also sent on 04/13/22.     Will check with provider to see if able to reschedule or if patient will be dismissed.

## 2022-06-20 NOTE — Telephone Encounter (Signed)
Provider said patient will be dismissed. Let us know what we need to do.

## 2022-06-20 NOTE — Telephone Encounter (Signed)
Provider said patient will be dismissed. Let us know what we need to do.

## 2022-06-21 NOTE — Telephone Encounter (Signed)
Patient called stating moving to [place] and may be going in a nursing home and will need his records.  I advised him that he would need to come in and sign a release of medical records and will need to know the name of home and their fax number.  He verbalized his understanding.

## 2022-06-23 NOTE — Telephone Encounter (Signed)
Request for dismissal given to office manager.

## 2022-06-27 NOTE — Telephone Encounter (Signed)
Tim [name] from [place] called asking if you were going to follow up Sk[employer] PT/OT and a Education officer, museum for this patient.    Please let me know, Thanks.

## 2022-06-28 NOTE — Telephone Encounter (Signed)
Anda Kraft from [facility] called again wanting to know if we will follow for home care.  I did speak with Matt at [facility] and he said to let him know.  There is not other provider to follow him.    I see no dismissal letter in system    [place]  [phone] opt. 1

## 2022-06-29 NOTE — Telephone Encounter (Signed)
Tim [name] wanting a dismissal letter sent.  Did he talk to you, Deb?

## 2022-06-29 NOTE — Telephone Encounter (Signed)
Sarah at [facility] to follow per Joey.

## 2022-08-20 NOTE — Telephone Encounter (Signed)
Formatting of this note is different from the original.      [facility] CALLBACK  Patient Name: Tim [name]    MRN: [id number]   Date of Callback: 08/20/2022    Pharmacist attempted to contact this patient per emergency department culture callback guidelines. Patient was unable to be contacted    Patient reviewed with Lennox Pippins PA-C    ALLERGIES  No Known Allergies    Type of Culture(s): Urine     Culture Results: Positive: E. coli    Change in treatment needed:Yes: Change treatment medication    Action Taken: Unable to reach patient, letter sent.    Lab Results: n/a    Home Medication List:    Prior to Admission medications as of <Not Reviewed>   Medication Sig Last Dose Taking   phenytoin ER (DILANTIN EXTENDED) 100 mg ER capsule Take 1 capsule by mouth three times a day.     cephALEXin (KEFLEX) 500 mg capsule Take 1 capsule by mouth two times a day for 5 days.     fidaxomicin (DIFICID) 200 mg tablet Take 1 tablet by mouth two times a day for 10 days.       Patient's Preferred Pharmacy:   e- [facility] [address] [place] [place] [address] - [address]  - [phone] [place]    Please page/call with any issues or questions.    Electronic signature: Dortha Schwalbe  August 20, 2022  2:11 PM    Pager/Extension: [phone]    Electronically signed by Thana Farr, RPh at 08/20/2022  2:12 PM EST

## 2022-08-29 ENCOUNTER — Telehealth

## 2022-08-29 NOTE — Telephone Encounter (Signed)
A social worker from [facility] called our office and stated that he is currently in the hospital and would like to have the labs you are requesting done so they are asking for them to be faxed to [facility] pt so please place orders and have them fax it to [facility] thanks !

## 2022-08-29 NOTE — Telephone Encounter (Signed)
I printed and faxed thank you

## 2022-08-30 LAB — LIPID PANEL

## 2022-08-30 LAB — URINALYSIS

## 2022-08-30 LAB — COMPREHENSIVE METABOLIC PANEL

## 2022-08-30 LAB — CBC
